# Patient Record
Sex: Female | Born: 1960 | Race: White | Hispanic: No | Marital: Married | Smoking: Former smoker
Health system: Southern US, Community
[De-identification: ages and names within clinical notes are randomized; demographics above are authoritative.]

## PROBLEM LIST (undated history)

## (undated) DIAGNOSIS — Z800 Family history of malignant neoplasm of digestive organs: Secondary | ICD-10-CM

## (undated) DIAGNOSIS — F32A Depression, unspecified: Secondary | ICD-10-CM

## (undated) DIAGNOSIS — F329 Major depressive disorder, single episode, unspecified: Secondary | ICD-10-CM

## (undated) DIAGNOSIS — N870 Mild cervical dysplasia: Secondary | ICD-10-CM

## (undated) DIAGNOSIS — R928 Other abnormal and inconclusive findings on diagnostic imaging of breast: Secondary | ICD-10-CM

## (undated) DIAGNOSIS — G4700 Insomnia, unspecified: Secondary | ICD-10-CM

## (undated) DIAGNOSIS — T7840XA Allergy, unspecified, initial encounter: Secondary | ICD-10-CM

## (undated) DIAGNOSIS — R7989 Other specified abnormal findings of blood chemistry: Secondary | ICD-10-CM

## (undated) HISTORY — DX: Mild cervical dysplasia: N87.0

## (undated) HISTORY — DX: Depression, unspecified: F32.A

## (undated) HISTORY — DX: Major depressive disorder, single episode, unspecified: F32.9

## (undated) HISTORY — DX: Other specified abnormal findings of blood chemistry: R79.89

## (undated) HISTORY — DX: Other abnormal and inconclusive findings on diagnostic imaging of breast: R92.8

## (undated) HISTORY — DX: Allergy, unspecified, initial encounter: T78.40XA

## (undated) HISTORY — DX: Insomnia, unspecified: G47.00

## (undated) HISTORY — DX: Family history of malignant neoplasm of digestive organs: Z80.0

## (undated) HISTORY — PX: TUBAL LIGATION: SHX77

---

## 1987-01-09 HISTORY — PX: CHOLECYSTECTOMY: SHX55

## 1987-01-09 HISTORY — PX: VAGINA SURGERY: SHX829

## 2003-12-16 ENCOUNTER — Ambulatory Visit: Payer: Self-pay | Admitting: Podiatry

## 2004-01-09 HISTORY — PX: FOOT SURGERY: SHX648

## 2007-01-09 DIAGNOSIS — N870 Mild cervical dysplasia: Secondary | ICD-10-CM

## 2007-01-09 HISTORY — DX: Mild cervical dysplasia: N87.0

## 2010-03-29 ENCOUNTER — Ambulatory Visit: Payer: Self-pay | Admitting: Nephrology

## 2011-01-09 HISTORY — PX: COLONOSCOPY: SHX174

## 2011-03-02 ENCOUNTER — Ambulatory Visit: Payer: Self-pay | Admitting: Gastroenterology

## 2014-09-20 ENCOUNTER — Other Ambulatory Visit: Payer: Self-pay | Admitting: Family Medicine

## 2014-09-20 ENCOUNTER — Ambulatory Visit (INDEPENDENT_AMBULATORY_CARE_PROVIDER_SITE_OTHER): Payer: BLUE CROSS/BLUE SHIELD | Admitting: Family Medicine

## 2014-09-20 ENCOUNTER — Encounter: Payer: Self-pay | Admitting: Family Medicine

## 2014-09-20 VITALS — BP 140/92 | HR 95 | Temp 97.8°F | Resp 16 | Wt 163.8 lb

## 2014-09-20 DIAGNOSIS — R1013 Epigastric pain: Secondary | ICD-10-CM | POA: Diagnosis not present

## 2014-09-20 DIAGNOSIS — F32A Depression, unspecified: Secondary | ICD-10-CM | POA: Insufficient documentation

## 2014-09-20 DIAGNOSIS — J302 Other seasonal allergic rhinitis: Secondary | ICD-10-CM | POA: Insufficient documentation

## 2014-09-20 DIAGNOSIS — K635 Polyp of colon: Secondary | ICD-10-CM | POA: Insufficient documentation

## 2014-09-20 DIAGNOSIS — F329 Major depressive disorder, single episode, unspecified: Secondary | ICD-10-CM | POA: Insufficient documentation

## 2014-09-20 DIAGNOSIS — Z8709 Personal history of other diseases of the respiratory system: Secondary | ICD-10-CM | POA: Insufficient documentation

## 2014-09-20 NOTE — Addendum Note (Signed)
Addended by: Jules Schick on: 09/20/2014 03:23 PM   Modules accepted: Orders

## 2014-09-20 NOTE — Progress Notes (Signed)
Patient ID: Rachael Carr, female   DOB: [birth year], 54 y.o.   MRN: [id number]   [name] Female    DOB: Sep 03, 1960   54 y.o.   MRN: [id number] Visit Date: 09/20/2014  Today's Provider: Vernie Murders, PA   Chief Complaint  Patient presents with  . Abdominal Pain    X 2 weeks   Subjective:    Abdominal Pain This is a new problem. The current episode started 1 to 4 weeks ago. The onset quality is sudden. The problem occurs constantly. The problem has been gradually worsening. The pain is located in the generalized abdominal region. The quality of the pain is burning. She has tried antacids for the symptoms. The treatment provided no relief.  Wake up with gnawing in epigastric region, without gas problems and associated with some burning. Helped by use of Alka-Seltzer today. 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 Family history of mother having PUD and father had pancreatic cancer before he died. Denies melena, diarrhea, hematemesis or vomiting. Has used Zantac 75 mg today because discomfort woke her from sleep last night.   Patient Active Problem List   Diagnosis Date Noted  . Depression 09/20/2014  . Colon polyp 09/20/2014  . Allergic rhinitis, seasonal 09/20/2014  . H/O bronchitis 09/20/2014   Past Surgical History  Procedure Laterality Date  . Cholecystectomy  1989  . Vagina surgery  1989  . Foot surgery Left 2006   Family History  Problem Relation Age of Onset  . Cancer Father    Allergies  Allergen Reactions  . Demerol [Meperidine]    Previous Medications   CETIRIZINE (ZYRTEC) 10 MG TABLET    Take 10 mg by mouth daily.   MELATONIN 3 MG CAPS    Take 1 capsule by mouth at bedtime as needed.   MULTIPLE MINERALS-VITAMINS (CITRACAL PLUS PO)    Take 1 tablet by mouth daily.   RANITIDINE HCL (ZANTAC PO)    Take by mouth as needed.   TRIAMCINOLONE (NASACORT ALLERGY 24HR) 55 MCG/ACT AERO NASAL INHALER    Place 2 sprays into the nose  daily.   Review of Systems  Constitutional: Negative.   HENT: Negative.   Eyes: Negative.   Respiratory: Negative.   Cardiovascular: Negative.   Gastrointestinal: Positive for abdominal pain.  Endocrine: Negative.   Genitourinary: Negative.   Musculoskeletal: Negative.   Allergic/Immunologic: Negative.   Neurological: Negative.   Hematological: Negative.   Psychiatric/Behavioral: Negative.    Social History  Substance Use Topics  . Smoking status: Former Smoker -- 8 years    Types: Cigarettes  . Smokeless tobacco: Never Used     Comment: QUIT IN 1980'S  . Alcohol Use: 0.0 oz/week    0 Standard drinks or equivalent per week     Comment: OCCASIONALLY   Objective:   BP 140/92 mmHg  Pulse 95  Temp(Src) 97.8 F (36.6 C) (Oral)  Resp 16  Wt 163 lb 12.8 oz (74.299 kg)  SpO2 98%  Physical Exam  Constitutional: She is oriented to person, place, and time. She appears well-developed and well-nourished. No distress.  HENT:  Head: Normocephalic and atraumatic.  Right Ear: Hearing normal.  Left Ear: Hearing normal.  Nose: Nose normal.  Eyes: Conjunctivae, EOM and lids are normal. Right eye exhibits no discharge. Left eye exhibits no discharge. No scleral icterus.  Neck: Normal range of motion. Neck supple.  Cardiovascular: Normal rate and regular rhythm.   Pulmonary/Chest: Effort normal and  breath sounds normal. No respiratory distress.  Abdominal: Soft. Bowel sounds are normal.  Minimal epigastric discomfort with deep palpation. No organomegaly or masses. BS wnl.  Musculoskeletal: Normal range of motion.  Neurological: She is alert and oriented to person, place, and time.  Skin: Skin is intact. No lesion and no rash noted.  Psychiatric: She has a normal mood and affect. Her speech is normal and behavior is normal. Thought content normal.      Assessment & Plan:     1. Epigastric discomfort Onset over the past couple weeks. Suspect dyspepsia. Will rule out PUD, GERD,  hepatitis, pancreatitis and signs of infection. Need to increase Zantac to 150 mg BID or try OTC Prilosec 20 mg qd. Recommend bland diet and limit acidic, spicy foods or caffeine. Recheck pending reports. - CBC with Differential/Platelet - COMPLETE METABOLIC PANEL WITH GFR - Amylase - H. pylori breath test

## 2014-09-23 LAB — CBC WITH DIFFERENTIAL/PLATELET
BASOS: 1 %
Basophils Absolute: 0 10*3/uL (ref 0.0–0.2)
EOS (ABSOLUTE): 0.1 10*3/uL (ref 0.0–0.4)
EOS: 1 %
HEMATOCRIT: 41.8 % (ref 34.0–46.6)
HEMOGLOBIN: 14.2 g/dL (ref 11.1–15.9)
IMMATURE GRANS (ABS): 0 10*3/uL (ref 0.0–0.1)
IMMATURE GRANULOCYTES: 0 %
LYMPHS: 30 %
Lymphocytes Absolute: 2.3 10*3/uL (ref 0.7–3.1)
MCH: 29.2 pg (ref 26.6–33.0)
MCHC: 34 g/dL (ref 31.5–35.7)
MCV: 86 fL (ref 79–97)
MONOCYTES: 6 %
MONOS ABS: 0.5 10*3/uL (ref 0.1–0.9)
NEUTROS PCT: 62 %
Neutrophils Absolute: 4.9 10*3/uL (ref 1.4–7.0)
Platelets: 351 10*3/uL (ref 150–379)
RBC: 4.86 x10E6/uL (ref 3.77–5.28)
RDW: 13.4 % (ref 12.3–15.4)
WBC: 7.8 10*3/uL (ref 3.4–10.8)

## 2014-09-23 LAB — COMPREHENSIVE METABOLIC PANEL
A/G RATIO: 1.9 (ref 1.1–2.5)
ALBUMIN: 4.9 g/dL (ref 3.5–5.5)
ALT: 42 IU/L — ABNORMAL HIGH (ref 0–32)
AST: 27 IU/L (ref 0–40)
Alkaline Phosphatase: 88 IU/L (ref 39–117)
BUN/Creatinine Ratio: 14 (ref 9–23)
BUN: 9 mg/dL (ref 6–24)
Bilirubin Total: 0.5 mg/dL (ref 0.0–1.2)
CALCIUM: 10.2 mg/dL (ref 8.7–10.2)
CO2: 25 mmol/L (ref 18–29)
CREATININE: 0.66 mg/dL (ref 0.57–1.00)
Chloride: 100 mmol/L (ref 97–108)
GFR, EST AFRICAN AMERICAN: 114 mL/min/{1.73_m2} (ref >59)
GFR, EST NON AFRICAN AMERICAN: 99 mL/min/{1.73_m2} (ref >59)
GLOBULIN, TOTAL: 2.6 g/dL (ref 1.5–4.5)
Glucose: 91 mg/dL (ref 65–99)
POTASSIUM: 5.5 mmol/L — AB (ref 3.5–5.2)
SODIUM: 142 mmol/L (ref 134–144)
TOTAL PROTEIN: 7.5 g/dL (ref 6.0–8.5)

## 2014-09-23 LAB — AMYLASE: Amylase: 56 U/L (ref 31–124)

## 2014-09-23 LAB — IFOBT (OCCULT BLOOD): IFOBT: NEGATIVE

## 2014-09-23 LAB — H. PYLORI BREATH TEST: H. PYLORI UBIT: NEGATIVE

## 2014-09-27 ENCOUNTER — Telehealth: Payer: Self-pay

## 2014-09-27 NOTE — Telephone Encounter (Signed)
Patient advised as directed below. Patient verbalized understanding and agrees with treatment plan. Lab results printed at front desk for pick up per patient's request.

## 2014-09-27 NOTE — Telephone Encounter (Signed)
-----   Message from Margo Common, [place] sent at 09/24/2014  6:30 PM EDT ----- No sign of infection or blood loss/anemia on blood cell counts. Pancreas test normal and negative H.pylori test (the bacteria that can cause ulcers). Potassium slightly elevated. Should not use "No-Salt" (a salt substitute that has a lot of potassium in it) and drink extra water. Proceed with use of full dose Zantac (150 mg BID) or Prilosec 20 mg qd. If no improvement in epigastric discomfort, will need to schedule referral to the gastroenterologist.

## 2014-11-26 ENCOUNTER — Encounter: Payer: Self-pay | Admitting: Family Medicine

## 2014-12-13 ENCOUNTER — Ambulatory Visit (INDEPENDENT_AMBULATORY_CARE_PROVIDER_SITE_OTHER): Payer: BLUE CROSS/BLUE SHIELD | Admitting: Family Medicine

## 2014-12-13 ENCOUNTER — Encounter: Payer: Self-pay | Admitting: Family Medicine

## 2014-12-13 ENCOUNTER — Other Ambulatory Visit: Payer: Self-pay

## 2014-12-13 VITALS — BP 104/82 | HR 68 | Temp 98.1°F | Resp 16 | Wt 151.8 lb

## 2014-12-13 DIAGNOSIS — Z7189 Other specified counseling: Secondary | ICD-10-CM | POA: Diagnosis not present

## 2014-12-13 DIAGNOSIS — Z789 Other specified health status: Secondary | ICD-10-CM | POA: Diagnosis not present

## 2014-12-13 DIAGNOSIS — Z9189 Other specified personal risk factors, not elsewhere classified: Secondary | ICD-10-CM

## 2014-12-13 DIAGNOSIS — Z23 Encounter for immunization: Secondary | ICD-10-CM

## 2014-12-13 NOTE — Progress Notes (Signed)
Patient ID: Rachael Carr, female   DOB: [birth year], 54 y.o.   MRN: [id number] Name: Rachael Carr   MRN: [id number]    DOB: 01/24/1960   Date:12/13/2014       Progress Note  Subjective  Chief Complaint  Chief Complaint  Patient presents with  . Labs Only  . Immunizations    HPI This 54 year old female requests pneumonia vaccination because of her history of allergies and past bronchopneumonia. No recent fever or congestion symptoms. Also, questions need for hepatitis C blood test. Asymptomatic.   Past Medical History  Diagnosis Date  . Depression   . Allergy     Social History  Substance Use Topics  . Smoking status: Former Smoker -- 8 years    Types: Cigarettes  . Smokeless tobacco: Never Used     Comment: QUIT IN 1980'S  . Alcohol Use: 0.0 oz/week    0 Standard drinks or equivalent per week     Comment: OCCASIONALLY     Current outpatient prescriptions:  .  cetirizine (ZYRTEC) 10 MG tablet, Take 10 mg by mouth daily., Disp: , Rfl:  .  fluticasone (FLONASE) 50 MCG/ACT nasal spray, Place into both nostrils daily., Disp: , Rfl:  .  montelukast (SINGULAIR) 10 MG tablet, , Disp: , Rfl: 10 .  Multiple Minerals-Vitamins (CITRACAL PLUS PO), Take 1 tablet by mouth daily., Disp: , Rfl:  .  RaNITidine HCl (ZANTAC PO), Take by mouth as needed., Disp: , Rfl:  .  triamcinolone (NASACORT ALLERGY 24HR) 55 MCG/ACT AERO nasal inhaler, Place 2 sprays into the nose daily., Disp: , Rfl:  .  zolpidem (AMBIEN) 5 MG tablet, TAKE 1/2 - 1 TABLET BY MOUTH AT BEDTIME, Disp: , Rfl: 2  Allergies  Allergen Reactions  . Demerol [Meperidine]     Review of Systems  Constitutional: Negative.   Respiratory: Negative.   Cardiovascular: Negative.   Gastrointestinal: Positive for diarrhea.       Occurs with greasy foods since having GB surgery 29 years ago.  Musculoskeletal: Negative.   Neurological: Negative.   Psychiatric/Behavioral: Negative.    Objective  Filed Vitals:   12/13/14 0813   BP: 104/82  Pulse: 68  Temp: 98.1 F (36.7 C)  TempSrc: Oral  Resp: 16  Weight: 151 lb 12.8 oz (68.856 kg)    Physical Exam  Constitutional: She is oriented to person, place, and time and well-developed, well-nourished, and in no distress.  HENT:  Head: Normocephalic.  Eyes: Conjunctivae and EOM are normal.  Neck: Normal range of motion. Neck supple.  Cardiovascular: Normal rate, regular rhythm and normal heart sounds.   Pulmonary/Chest: Effort normal and breath sounds normal.  Abdominal: Soft. Bowel sounds are normal. She exhibits mass.  Musculoskeletal: Normal range of motion.  Neurological: She is alert and oriented to person, place, and time.  Skin: No rash noted.  Psychiatric: Affect and judgment normal.    Recent Results (from the past 2160 hour(s))  CBC with Differential/Platelet     Status: None   Collection Time: 09/20/14  3:34 PM  Result Value Ref Range   WBC 7.8 3.4 - 10.8 x10E3/uL   RBC 4.86 3.77 - 5.28 x10E6/uL   Hemoglobin 14.2 11.1 - 15.9 g/dL   Hematocrit 41.8 34.0 - 46.6 %   MCV 86 79 - 97 fL   MCH 29.2 26.6 - 33.0 pg   MCHC 34.0 31.5 - 35.7 g/dL   RDW 13.4 12.3 - 15.4 %   Platelets 351 150 -  379 x10E3/uL   Neutrophils 62 %   Lymphs 30 %   Monocytes 6 %   Eos 1 %   Basos 1 %   Neutrophils Absolute 4.9 1.4 - 7.0 x10E3/uL   Lymphocytes Absolute 2.3 0.7 - 3.1 x10E3/uL   Monocytes Absolute 0.5 0.1 - 0.9 x10E3/uL   EOS (ABSOLUTE) 0.1 0.0 - 0.4 x10E3/uL   Basophils Absolute 0.0 0.0 - 0.2 x10E3/uL   Immature Granulocytes 0 %   Immature Grans (Abs) 0.0 0.0 - 0.1 x10E3/uL  Comprehensive metabolic panel     Status: Abnormal   Collection Time: 09/20/14  3:34 PM  Result Value Ref Range   Glucose 91 65 - 99 mg/dL   BUN 9 6 - 24 mg/dL   Creatinine, Ser 0.66 0.57 - 1.00 mg/dL   GFR calc non Af Amer 99 >59 mL/min/1.73   GFR calc Af Amer 114 >59 mL/min/1.73   BUN/Creatinine Ratio 14 9 - 23   Sodium 142 134 - 144 mmol/L   Potassium 5.5 (H) 3.5 - 5.2 mmol/L    Chloride 100 97 - 108 mmol/L   CO2 25 18 - 29 mmol/L   Calcium 10.2 8.7 - 10.2 mg/dL   Total Protein 7.5 6.0 - 8.5 g/dL   Albumin 4.9 3.5 - 5.5 g/dL   Globulin, Total 2.6 1.5 - 4.5 g/dL   Albumin/Globulin Ratio 1.9 1.1 - 2.5   Bilirubin Total 0.5 0.0 - 1.2 mg/dL   Alkaline Phosphatase 88 39 - 117 IU/L   AST 27 0 - 40 IU/L   ALT 42 (H) 0 - 32 IU/L  Amylase     Status: None   Collection Time: 09/20/14  3:34 PM  Result Value Ref Range   Amylase 56 31 - 124 U/L  H. pylori breath test     Status: None   Collection Time: 09/20/14  3:34 PM  Result Value Ref Range   H. pylori UBiT Negative Negative  IFOBT POC (occult bld, rslt in office)     Status: Normal   Collection Time: 09/23/14  8:40 AM  Result Value Ref Range   IFOBT Negative     Assessment & Plan  1. Counseling on health promotion and disease prevention Feeling well and had flu shot in October 2016 at [facility]. Saw the commercial regarding Hepatitis C blood test for "baby boomers". Husband had his physical and got the blood test. Asymptomatic without IV drug use history. Will wait until her physical next year to get the blood test done after counseling.  2. Streptococcus pneumoniae vaccination indicated Requests pneumonia vaccination since she her GYN advised getting with her history of past bronchopneumonia and allergies. Will give Pnemovax today and plan Prevnar-13 at age 58. No sign of infections today.

## 2015-08-02 DIAGNOSIS — N939 Abnormal uterine and vaginal bleeding, unspecified: Secondary | ICD-10-CM | POA: Diagnosis not present

## 2015-08-25 DIAGNOSIS — N840 Polyp of corpus uteri: Secondary | ICD-10-CM | POA: Diagnosis not present

## 2015-08-25 DIAGNOSIS — N950 Postmenopausal bleeding: Secondary | ICD-10-CM | POA: Diagnosis not present

## 2015-09-20 ENCOUNTER — Encounter
Admission: RE | Admit: 2015-09-20 | Discharge: 2015-09-20 | Disposition: A | Discharge status: Home / Self Care | Payer: BLUE CROSS/BLUE SHIELD | Source: Ambulatory Visit | Attending: Obstetrics and Gynecology | Admitting: Obstetrics and Gynecology

## 2015-09-20 DIAGNOSIS — Z01812 Encounter for preprocedural laboratory examination: Secondary | ICD-10-CM | POA: Insufficient documentation

## 2015-09-20 DIAGNOSIS — N950 Postmenopausal bleeding: Secondary | ICD-10-CM | POA: Diagnosis not present

## 2015-09-20 DIAGNOSIS — Z01818 Encounter for other preprocedural examination: Secondary | ICD-10-CM | POA: Insufficient documentation

## 2015-09-20 DIAGNOSIS — N840 Polyp of corpus uteri: Secondary | ICD-10-CM | POA: Diagnosis not present

## 2015-09-20 LAB — HEMOGLOBIN: HEMOGLOBIN: 14.4 g/dL (ref 12.0–16.0)

## 2015-09-20 NOTE — Patient Instructions (Signed)
Your procedure is scheduled on: Tuesday 09/27/15 Report to Day Surgery. 2ND FLOOR MEDICAL MALL ENTRANCE To find out your arrival time please call [phone] between 1PM - 3PM on Monday 09/26/15.  Remember: Instructions that are not followed completely may result in serious medical risk, up to and including death, or upon the discretion of your surgeon and anesthesiologist your surgery may need to be rescheduled.    __X__ 1. Do not eat food or drink liquids after midnight. No gum chewing or hard candies.     __X__ 2. No Alcohol/SMOKING for 24 hours before or after surgery.   ____ 3. Bring all medications with you on the day of surgery if instructed.    __X__ 4. Notify your doctor if there is any change in your medical condition     (cold, fever, infections).     Do not wear jewelry, make-up, hairpins, clips or nail polish.  Do not wear lotions, powders, or perfumes.   Do not shave 48 hours prior to surgery. Men may shave face and neck.  Do not bring valuables to the hospital.    [facility] is not responsible for any belongings or valuables.               Contacts, dentures or bridgework may not be worn into surgery.  Leave your suitcase in the car. After surgery it may be brought to your room.  For patients admitted to the hospital, discharge time is determined by your                treatment team.   Patients discharged the day of surgery will not be allowed to drive home.   Please read over the following fact sheets that you were given:   Surgical Site Infection Prevention   __X__ Take these medicines the morning of surgery with A SIP OF WATER:    1. ZYRTEC  2.   3.   4.  5.  6.  ____ Fleet Enema (as directed)   ____ Use CHG Soap as directed  ____ Use inhalers on the day of surgery  ____ Stop metformin 2 days prior to surgery    ____ Take 1/2 of usual insulin dose the night before surgery and none on the morning of surgery.   ____ Stop Coumadin/Plavix/aspirin on    ____ Stop Anti-inflammatories on    __X__ Stop supplements until after surgery.    ____ Bring C-Pap to the hospital.

## 2015-09-27 ENCOUNTER — Ambulatory Visit: Payer: BLUE CROSS/BLUE SHIELD | Admitting: Anesthesiology

## 2015-09-27 ENCOUNTER — Encounter
Admission: RE | Disposition: A | Discharge status: Home / Self Care | Payer: Self-pay | Source: Ambulatory Visit | Attending: Obstetrics and Gynecology

## 2015-09-27 ENCOUNTER — Encounter: Payer: Self-pay | Admitting: *Deleted

## 2015-09-27 ENCOUNTER — Ambulatory Visit
Admission: RE | Admit: 2015-09-27 | Discharge: 2015-09-27 | Disposition: A | Discharge status: Home / Self Care | Payer: BLUE CROSS/BLUE SHIELD | Source: Ambulatory Visit | Attending: Obstetrics and Gynecology | Admitting: Obstetrics and Gynecology

## 2015-09-27 DIAGNOSIS — N950 Postmenopausal bleeding: Secondary | ICD-10-CM | POA: Diagnosis not present

## 2015-09-27 DIAGNOSIS — J309 Allergic rhinitis, unspecified: Secondary | ICD-10-CM | POA: Diagnosis not present

## 2015-09-27 DIAGNOSIS — Z87891 Personal history of nicotine dependence: Secondary | ICD-10-CM | POA: Insufficient documentation

## 2015-09-27 DIAGNOSIS — Z885 Allergy status to narcotic agent status: Secondary | ICD-10-CM | POA: Insufficient documentation

## 2015-09-27 DIAGNOSIS — Z888 Allergy status to other drugs, medicaments and biological substances status: Secondary | ICD-10-CM | POA: Insufficient documentation

## 2015-09-27 DIAGNOSIS — Z79899 Other long term (current) drug therapy: Secondary | ICD-10-CM | POA: Diagnosis not present

## 2015-09-27 DIAGNOSIS — N840 Polyp of corpus uteri: Secondary | ICD-10-CM | POA: Insufficient documentation

## 2015-09-27 DIAGNOSIS — Z833 Family history of diabetes mellitus: Secondary | ICD-10-CM | POA: Insufficient documentation

## 2015-09-27 DIAGNOSIS — Z800 Family history of malignant neoplasm of digestive organs: Secondary | ICD-10-CM | POA: Diagnosis not present

## 2015-09-27 DIAGNOSIS — K219 Gastro-esophageal reflux disease without esophagitis: Secondary | ICD-10-CM | POA: Insufficient documentation

## 2015-09-27 DIAGNOSIS — Z9049 Acquired absence of other specified parts of digestive tract: Secondary | ICD-10-CM | POA: Diagnosis not present

## 2015-09-27 DIAGNOSIS — Z8249 Family history of ischemic heart disease and other diseases of the circulatory system: Secondary | ICD-10-CM | POA: Diagnosis not present

## 2015-09-27 HISTORY — PX: DILATATION & CURETTAGE/HYSTEROSCOPY WITH MYOSURE: SHX6511

## 2015-09-27 SURGERY — DILATATION & CURETTAGE/HYSTEROSCOPY WITH MYOSURE
Anesthesia: General

## 2015-09-27 MED ORDER — LIDOCAINE HCL (CARDIAC) 20 MG/ML IV SOLN
INTRAVENOUS | Status: DC | PRN
  Administered 2015-09-27: 50 mg via INTRAVENOUS

## 2015-09-27 MED ORDER — FAMOTIDINE 20 MG PO TABS
ORAL_TABLET | ORAL | Status: AC
  Filled 2015-09-27: qty 1

## 2015-09-27 MED ORDER — FENTANYL CITRATE (PF) 100 MCG/2ML IJ SOLN
INTRAMUSCULAR | Status: DC | PRN
  Administered 2015-09-27: 100 ug via INTRAVENOUS

## 2015-09-27 MED ORDER — FENTANYL CITRATE (PF) 100 MCG/2ML IJ SOLN
INTRAMUSCULAR | Status: AC
  Administered 2015-09-27: 25 ug via INTRAVENOUS
  Filled 2015-09-27: qty 2

## 2015-09-27 MED ORDER — KETOROLAC TROMETHAMINE 30 MG/ML IJ SOLN
INTRAMUSCULAR | Status: DC | PRN
Start: 2015-09-27 — End: 2015-09-27
  Administered 2015-09-27: 30 mg via INTRAVENOUS

## 2015-09-27 MED ORDER — IBUPROFEN 600 MG PO TABS: 600.0000 mg | ORAL_TABLET | Freq: Four times a day (QID) | ORAL | 0 refills | Status: DC | PRN

## 2015-09-27 MED ORDER — ONDANSETRON HCL 4 MG/2ML IJ SOLN: 4.0000 mg | Freq: Once | INTRAMUSCULAR | Status: DC | PRN

## 2015-09-27 MED ORDER — PROPOFOL 10 MG/ML IV BOLUS
INTRAVENOUS | Status: DC | PRN
  Administered 2015-09-27: 200 mg via INTRAVENOUS

## 2015-09-27 MED ORDER — LACTATED RINGERS IV SOLN
INTRAVENOUS | Status: DC
  Administered 2015-09-27: 09:00:00 via INTRAVENOUS

## 2015-09-27 MED ORDER — TRAMADOL HCL 50 MG PO TABS: 50.0000 mg | ORAL_TABLET | Freq: Four times a day (QID) | ORAL | 0 refills | Status: DC | PRN

## 2015-09-27 MED ORDER — FENTANYL CITRATE (PF) 100 MCG/2ML IJ SOLN
25.0000 ug | INTRAMUSCULAR | Status: AC | PRN
  Administered 2015-09-27 (×6): 25 ug via INTRAVENOUS

## 2015-09-27 MED ORDER — ONDANSETRON HCL 4 MG/2ML IJ SOLN
INTRAMUSCULAR | Status: DC | PRN
  Administered 2015-09-27: 4 mg via INTRAVENOUS

## 2015-09-27 MED ORDER — DEXAMETHASONE SODIUM PHOSPHATE 10 MG/ML IJ SOLN
INTRAMUSCULAR | Status: DC | PRN
  Administered 2015-09-27: 8 mg via INTRAVENOUS

## 2015-09-27 MED ORDER — MIDAZOLAM HCL 2 MG/2ML IJ SOLN
INTRAMUSCULAR | Status: DC | PRN
  Administered 2015-09-27: 2 mg via INTRAVENOUS

## 2015-09-27 MED ORDER — FAMOTIDINE 20 MG PO TABS
20.0000 mg | ORAL_TABLET | Freq: Once | ORAL | Status: AC
  Administered 2015-09-27: 20 mg via ORAL

## 2015-09-27 SURGICAL SUPPLY — 19 items
ABLATOR ENDOMETRIAL MYOSURE (ABLATOR) IMPLANT
CANISTER SUC SOCK COL 7IN (MISCELLANEOUS) ×2 IMPLANT
CATH ROBINSON RED A/P 16FR (CATHETERS) ×2 IMPLANT
DEVICE MYOSURE LITE (MISCELLANEOUS) ×2 IMPLANT
ELECT REM PT RETURN 9FT ADLT (ELECTROSURGICAL) ×2
ELECTRODE REM PT RTRN 9FT ADLT (ELECTROSURGICAL) ×1 IMPLANT
GLOVE BIO SURGEON STRL SZ7 (GLOVE) ×6 IMPLANT
GLOVE BIOGEL PI IND STRL 7.5 (GLOVE) ×3 IMPLANT
GLOVE BIOGEL PI INDICATOR 7.5 (GLOVE) ×3
GOWN STRL REUS W/ TWL LRG LVL3 (GOWN DISPOSABLE) ×1 IMPLANT
GOWN STRL REUS W/TWL LRG LVL3 (GOWN DISPOSABLE) ×1
IV LACTATED RINGER IRRG 3000ML (IV SOLUTION) ×1
IV LR IRRIG 3000ML ARTHROMATIC (IV SOLUTION) ×1 IMPLANT
KIT RM TURNOVER CYSTO AR (KITS) ×2 IMPLANT
PACK DNC HYST (MISCELLANEOUS) ×2 IMPLANT
PAD OB MATERNITY 4.3X12.25 (PERSONAL CARE ITEMS) ×2 IMPLANT
PAD PREP 24X41 OB/GYN DISP (PERSONAL CARE ITEMS) ×2 IMPLANT
TUBING CONNECTING 10 (TUBING) ×2 IMPLANT
TUBING HYSTEROSCOPY DOLPHIN (MISCELLANEOUS) ×2 IMPLANT

## 2015-09-27 NOTE — Anesthesia Procedure Notes (Signed)
Procedure Name: LMA Insertion Date/Time: 09/27/2015 9:46 AM Performed by: Darlyne Russian Pre-anesthesia Checklist: Patient identified, Emergency Drugs available, Suction available and Patient being monitored Patient Re-evaluated:Patient Re-evaluated prior to inductionOxygen Delivery Method: Circle system utilized Preoxygenation: Pre-oxygenation with 100% oxygen Intubation Type: IV induction Ventilation: Mask ventilation without difficulty LMA: LMA inserted LMA Size: 3.0 Number of attempts: 1 Dental Injury: Teeth and Oropharynx as per pre-operative assessment

## 2015-09-27 NOTE — Transfer of Care (Signed)
Immediate Anesthesia Transfer of Care Note  [name]  Procedure(s) Performed: Procedure(s): DILATATION & CURETTAGE/HYSTEROSCOPY WITH MYOSURE (N/A)  Patient Location: PACU  Anesthesia Type:General  Level of Consciousness: responds to stimulation  Airway & Oxygen Therapy: Patient Spontanous Breathing and Patient connected to nasal cannula oxygen  Post-op Assessment: Report given to RN and Post -op Vital signs reviewed and stable  Post vital signs: Reviewed  Last Vitals:  Vitals:   09/27/15 0808 09/27/15 1038  BP: (!) 147/90 126/82  Pulse: 87 77  Resp: 16 10  Temp: 36.4 C 36.1 C    Last Pain:  Vitals:   09/27/15 0808  TempSrc: Tympanic  PainSc: 3          Complications: No apparent anesthesia complications

## 2015-09-27 NOTE — Anesthesia Postprocedure Evaluation (Signed)
Anesthesia Post Note  [name]  Procedure(s) Performed: Procedure(s) (LRB): DILATATION & CURETTAGE/HYSTEROSCOPY WITH MYOSURE (N/A)  Patient location during evaluation: PACU Anesthesia Type: General Level of consciousness: awake and alert Pain management: pain level controlled Vital Signs Assessment: post-procedure vital signs reviewed and stable Respiratory status: spontaneous breathing and respiratory function stable Cardiovascular status: stable Anesthetic complications: no    Last Vitals:  Vitals:   09/27/15 1123 09/27/15 1133  BP: 116/76   Pulse: 68 69  Resp: 10 17  Temp:  36.3 C    Last Pain:  Vitals:   09/27/15 1133  TempSrc:   PainSc: 2                  KEPHART,WILLIAM K

## 2015-09-27 NOTE — Anesthesia Preprocedure Evaluation (Signed)
Anesthesia Evaluation  Patient identified by MRN, date of birth, ID band Patient awake    Reviewed: Allergy & Precautions, NPO status , Patient's Chart, lab work & pertinent test results  History of Anesthesia Complications Negative for: history of anesthetic complications  Airway Mallampati: II       Dental   Pulmonary neg pulmonary ROS, former smoker,  Seasonal allergies          Cardiovascular negative cardio ROS       Neuro/Psych Depression negative neurological ROS     GI/Hepatic Neg liver ROS, GERD  Controlled,  Endo/Other  negative endocrine ROS  Renal/GU negative Renal ROS     Musculoskeletal   Abdominal   Peds  Hematology negative hematology ROS (+)   Anesthesia Other Findings   Reproductive/Obstetrics                             Anesthesia Physical Anesthesia Plan  ASA: II  Anesthesia Plan: General   Post-op Pain Management:    Induction: Intravenous  Airway Management Planned: LMA  Additional Equipment:   Intra-op Plan:   Post-operative Plan:   Informed Consent: I have reviewed the patients History and Physical, chart, labs and discussed the procedure including the risks, benefits and alternatives for the proposed anesthesia with the patient or authorized representative who has indicated his/her understanding and acceptance.     Plan Discussed with:   Anesthesia Plan Comments:         Anesthesia Quick Evaluation

## 2015-09-27 NOTE — H&P (Signed)
History and Physical Interval Note:  Rachael Carr  has presented today for surgery, with the diagnosis of ENDOMETRIAL POLYP  The various methods of treatment have been discussed with the patient and family. After consideration of risks, benefits and other options for treatment, the patient has consented to  Procedure(s): [place] (N/A) as a surgical intervention .  The patient's history has been reviewed, patient examined, no change in status, stable for surgery.  I have reviewed the patient's chart and labs.  Questions were answered to the patient's satisfaction.    Will Bonnet, MD 09/27/2015 9:30 AM

## 2015-09-27 NOTE — Op Note (Signed)
  Operative Note    Pre-Op Diagnosis:  1) postmenopausal bleeding 2) endometrial polyp  Post-Op Diagnosis:  1) postmenopausal bleeding 2) endometrial polyp  Procedures:  1. Dilation and Curettage 2. Hysteroscopy 3. Endometrial polypectomy  Primary Surgeon: Prentice Docker, MD   EBL: 10 mL   IVF: 600 mL   Urine output: 200 mL at beginning of procedure  Specimens:  1) Endometrial polyp 2) endometrial curettings  Drains: non  Complications: None   Disposition: PACU   Condition: Stable   Findings:  1) posterior endometrial polyp, approximately 3cm x 2cm 2) otherwise normal appearing endometrial cavity  Procedure Summary:  After informed consent was obtained, the patient was taken to the operating room where anesthesia was obtained without difficulty. The patient was positioned in the dorsal supine lithotomy position in candy cane stirrups and she was prepped and draped in the usual sterile manner.  After a time-out was called, the patient's bladder was catheterized with an in-and-out foley catheter.  The patient was examined under anesthesia, with the above noted findings.  The bi-valved speculum was placed in the vagina, and the the anterior lip of the cervix was grasped with the single-tooth tenaculum.  The cervix was progressively dilated to a 6 mm Hegar dilator.  The MyoSure hysteroscope was introduced, with the above noted findings. The small MyoSure device was attached to the hysteroscope.  The polyp was removed in its entirety using the MyoSure device.  The hystersocope was removed and the uterine cavity was curetted gently to take a general endometrial sample.  The hysteroscope was re-inserted and excellent hemostasis was noted.  The hysteroscope and all instruments were removed with verification that no sponges or instruments were left in the vagina.  She was then taken out of dorsal lithotomy position.  The patient tolerated the procedure well.  Sponge, lap and  needle counts were correct x2.  For VTE prophylaxis she was wearing SCDs throughout the case.  No antibiotics were indicated nor given. The patient was taken to recovery room in excellent condition.  Will Bonnet, MD 09/27/2015 10:31 AM

## 2015-09-27 NOTE — Discharge Instructions (Signed)

## 2015-09-27 NOTE — Anesthesia Procedure Notes (Signed)
Performed by: Ory Elting       

## 2015-09-28 LAB — SURGICAL PATHOLOGY

## 2015-10-10 ENCOUNTER — Other Ambulatory Visit: Payer: Self-pay | Admitting: Obstetrics and Gynecology

## 2015-10-10 DIAGNOSIS — Z1231 Encounter for screening mammogram for malignant neoplasm of breast: Secondary | ICD-10-CM

## 2015-11-02 DIAGNOSIS — D225 Melanocytic nevi of trunk: Secondary | ICD-10-CM | POA: Diagnosis not present

## 2015-11-14 ENCOUNTER — Ambulatory Visit
Admission: RE | Admit: 2015-11-14 | Discharge: 2015-11-14 | Disposition: A | Discharge status: Home / Self Care | Payer: BLUE CROSS/BLUE SHIELD | Source: Ambulatory Visit | Attending: Obstetrics and Gynecology | Admitting: Obstetrics and Gynecology

## 2015-11-14 DIAGNOSIS — Z23 Encounter for immunization: Secondary | ICD-10-CM | POA: Diagnosis not present

## 2015-11-14 DIAGNOSIS — H5203 Hypermetropia, bilateral: Secondary | ICD-10-CM | POA: Diagnosis not present

## 2015-11-14 DIAGNOSIS — Z1239 Encounter for other screening for malignant neoplasm of breast: Secondary | ICD-10-CM | POA: Diagnosis not present

## 2015-11-14 DIAGNOSIS — Z1231 Encounter for screening mammogram for malignant neoplasm of breast: Secondary | ICD-10-CM | POA: Diagnosis not present

## 2015-11-14 DIAGNOSIS — Z01419 Encounter for gynecological examination (general) (routine) without abnormal findings: Secondary | ICD-10-CM | POA: Diagnosis not present

## 2015-11-18 ENCOUNTER — Inpatient Hospital Stay
Admission: RE | Admit: 2015-11-18 | Discharge: 2015-11-18 | Disposition: A | Discharge status: Home / Self Care | Payer: Self-pay | Source: Ambulatory Visit | Attending: *Deleted | Admitting: *Deleted

## 2015-11-18 ENCOUNTER — Other Ambulatory Visit: Payer: Self-pay | Admitting: *Deleted

## 2015-11-18 DIAGNOSIS — Z9289 Personal history of other medical treatment: Secondary | ICD-10-CM

## 2015-12-08 DIAGNOSIS — Z7689 Persons encountering health services in other specified circumstances: Secondary | ICD-10-CM | POA: Diagnosis not present

## 2015-12-08 DIAGNOSIS — H6123 Impacted cerumen, bilateral: Secondary | ICD-10-CM | POA: Diagnosis not present

## 2015-12-08 DIAGNOSIS — J019 Acute sinusitis, unspecified: Secondary | ICD-10-CM | POA: Diagnosis not present

## 2015-12-08 DIAGNOSIS — F5102 Adjustment insomnia: Secondary | ICD-10-CM | POA: Diagnosis not present

## 2015-12-08 DIAGNOSIS — Z0000 Encounter for general adult medical examination without abnormal findings: Secondary | ICD-10-CM | POA: Diagnosis not present

## 2016-10-02 ENCOUNTER — Other Ambulatory Visit: Payer: Self-pay | Admitting: Obstetrics and Gynecology

## 2016-10-02 DIAGNOSIS — Z1231 Encounter for screening mammogram for malignant neoplasm of breast: Secondary | ICD-10-CM

## 2016-10-26 DIAGNOSIS — Z23 Encounter for immunization: Secondary | ICD-10-CM | POA: Diagnosis not present

## 2016-11-08 DIAGNOSIS — R928 Other abnormal and inconclusive findings on diagnostic imaging of breast: Secondary | ICD-10-CM

## 2016-11-08 HISTORY — DX: Other abnormal and inconclusive findings on diagnostic imaging of breast: R92.8

## 2016-11-15 ENCOUNTER — Ambulatory Visit (INDEPENDENT_AMBULATORY_CARE_PROVIDER_SITE_OTHER): Payer: BLUE CROSS/BLUE SHIELD | Admitting: Obstetrics and Gynecology

## 2016-11-15 ENCOUNTER — Encounter: Payer: Self-pay | Admitting: Obstetrics and Gynecology

## 2016-11-15 ENCOUNTER — Ambulatory Visit
Admission: RE | Admit: 2016-11-15 | Discharge: 2016-11-15 | Disposition: A | Discharge status: Home / Self Care | Payer: BLUE CROSS/BLUE SHIELD | Source: Ambulatory Visit | Attending: Obstetrics and Gynecology | Admitting: Obstetrics and Gynecology

## 2016-11-15 VITALS — BP 122/80 | HR 88 | Ht 66.0 in | Wt 160.0 lb

## 2016-11-15 DIAGNOSIS — Z1239 Encounter for other screening for malignant neoplasm of breast: Secondary | ICD-10-CM

## 2016-11-15 DIAGNOSIS — N6320 Unspecified lump in the left breast, unspecified quadrant: Secondary | ICD-10-CM | POA: Insufficient documentation

## 2016-11-15 DIAGNOSIS — J302 Other seasonal allergic rhinitis: Secondary | ICD-10-CM

## 2016-11-15 DIAGNOSIS — Z01419 Encounter for gynecological examination (general) (routine) without abnormal findings: Secondary | ICD-10-CM

## 2016-11-15 DIAGNOSIS — N952 Postmenopausal atrophic vaginitis: Secondary | ICD-10-CM

## 2016-11-15 DIAGNOSIS — H524 Presbyopia: Secondary | ICD-10-CM | POA: Diagnosis not present

## 2016-11-15 DIAGNOSIS — R928 Other abnormal and inconclusive findings on diagnostic imaging of breast: Secondary | ICD-10-CM | POA: Insufficient documentation

## 2016-11-15 DIAGNOSIS — Z1211 Encounter for screening for malignant neoplasm of colon: Secondary | ICD-10-CM | POA: Diagnosis not present

## 2016-11-15 DIAGNOSIS — Z1231 Encounter for screening mammogram for malignant neoplasm of breast: Secondary | ICD-10-CM | POA: Diagnosis not present

## 2016-11-15 DIAGNOSIS — G4700 Insomnia, unspecified: Secondary | ICD-10-CM

## 2016-11-15 DIAGNOSIS — Z124 Encounter for screening for malignant neoplasm of cervix: Secondary | ICD-10-CM

## 2016-11-15 DIAGNOSIS — Z1151 Encounter for screening for human papillomavirus (HPV): Secondary | ICD-10-CM | POA: Diagnosis not present

## 2016-11-15 MED ORDER — ZOLPIDEM TARTRATE 5 MG PO TABS: 5.0000 mg | ORAL_TABLET | Freq: Every evening | ORAL | 0 refills | Status: DC | PRN

## 2016-11-15 MED ORDER — MONTELUKAST SODIUM 10 MG PO TABS: 10.0000 mg | ORAL_TABLET | ORAL | 3 refills | Status: DC | PRN

## 2016-11-15 MED ORDER — ESTROGENS, CONJUGATED 0.625 MG/GM VA CREA: TOPICAL_CREAM | VAGINAL | 2 refills | Status: DC

## 2016-11-15 NOTE — Patient Instructions (Signed)
I value your feedback and appreciate you entrusting us with your care. If you get a [facility] patient survey, I would appreciate you taking the time to let us know what your experience was like. Thank you! 

## 2016-11-15 NOTE — Progress Notes (Signed)
PCP: Maryland Pink, MD   Chief Complaint  Patient presents with  . Gynecologic Exam    HPI:      [name] is a 56 y.o. [phone] who LMP was No LMP recorded. Patient is postmenopausal., presents today for her annual examination.  Her menses are absent due to menopause. She does not have intermenstrual bleeding. She is s/p D&C/polypectomy/hysteroscopy 9/17 for PMB. PMB resolved after procedure.  She does have vasomotor sx that are tolerable. She also has occas insomnia and takes zolpidem sparingly, when absolutely needed. She needs Rx RF.   Sex activity: single partner, contraception - post menopausal status. She does have vaginal dryness and scant amount of bleeding and pain with sex. She has tried lubricants without relief. She used vag ERT in the past with sx relief. She is interested in retrying it.   Last Pap: November 09, 2013  Results were: no abnormalities /neg HPV DNA.  Hx of STDs: none  Last mammogram: November 14, 2015  Results were: normal--routine follow-up in 12 months. Mammo appt today. There is no FH of breast cancer. There is no FH of ovarian cancer. The patient does do self-breast exams.  Colonoscopy: colonoscopy 5 years ago with polyps/ abnormalities with Dr. Gustavo Lah. Repeat due after 5 years. Pt doesn't remember where it was last done but wants ref.   Tobacco use: The patient denies current or previous tobacco use. Alcohol use: social drinker Exercise: moderately active  She does get adequate calcium and Vitamin D in her diet.  Labs with PCP.  Pt needs RF on singulair for allergies. Uses OTC flonase.    Past Medical History:  Diagnosis Date  . Allergy   . Depression   . Dysplasia of cervix, low grade (CIN 1) 2009  . Insomnia     Past Surgical History:  Procedure Laterality Date  . CHOLECYSTECTOMY  1989  . COLONOSCOPY  2013   polyp; repeat in 5 yrs.  Marland Kitchen FOOT SURGERY Left 2006  . TUBAL LIGATION    . VAGINA SURGERY  1989    Family History   Problem Relation Age of Onset  . Cancer Father     Social History   Socioeconomic History  . Marital status: Married    Spouse name: Not on file  . Number of children: Not on file  . Years of education: Not on file  . Highest education level: Not on file  Social Needs  . Financial resource strain: Not on file  . Food insecurity - worry: Not on file  . Food insecurity - inability: Not on file  . Transportation needs - medical: Not on file  . Transportation needs - non-medical: Not on file  Occupational History  . Not on file  Tobacco Use  . Smoking status: Former Smoker    Years: 8.00    Types: Cigarettes  . Smokeless tobacco: Never Used  . Tobacco comment: QUIT IN 1980'S  Substance and Sexual Activity  . Alcohol use: Yes    Alcohol/week: 0.0 oz    Comment: OCCASIONALLY  . Drug use: No  . Sexual activity: Not on file  Other Topics Concern  . Not on file  Social History Narrative  . Not on file    Current Meds  Medication Sig  . cetirizine (ZYRTEC) 10 MG tablet Take 10 mg by mouth daily.  . fluticasone (FLONASE) 50 MCG/ACT nasal spray Place into both nostrils daily.  Marland Kitchen ibuprofen (ADVIL,MOTRIN) 600 MG tablet Take 1 tablet (600  mg total) by mouth every 6 (six) hours as needed for mild pain or cramping.  Marland Kitchen MELATONIN PO Take 10 mg by mouth at bedtime as needed.  . montelukast (SINGULAIR) 10 MG tablet Take 1 tablet (10 mg total) as needed by mouth.  . Multiple Minerals-Vitamins (CITRACAL PLUS PO) Take 1 tablet by mouth daily.  Marland Kitchen triamcinolone (NASACORT ALLERGY 24HR) 55 MCG/ACT AERO nasal inhaler Place 2 sprays into the nose daily.  . [DISCONTINUED] montelukast (SINGULAIR) 10 MG tablet Take 10 mg by mouth as needed.      ROS:  Review of Systems  Constitutional: Negative for fatigue, fever and unexpected weight change.  Respiratory: Negative for cough, shortness of breath and wheezing.   Cardiovascular: Negative for chest pain, palpitations and leg swelling.    Gastrointestinal: Negative for blood in stool, constipation, diarrhea, nausea and vomiting.  Endocrine: Negative for cold intolerance, heat intolerance and polyuria.  Genitourinary: Positive for dyspareunia. Negative for dysuria, flank pain, frequency, genital sores, hematuria, menstrual problem, pelvic pain, urgency, vaginal bleeding, vaginal discharge and vaginal pain.  Musculoskeletal: Negative for back pain, joint swelling and myalgias.  Skin: Negative for rash.  Neurological: Negative for dizziness, syncope, light-headedness, numbness and headaches.  Hematological: Negative for adenopathy.  Psychiatric/Behavioral: Negative for agitation, confusion, sleep disturbance and suicidal ideas. The patient is not nervous/anxious.      Objective: BP 122/80   Pulse 88   Ht 5\' 6"  (1.676 m)   Wt 160 lb (72.6 kg)   BMI 25.82 kg/m    Physical Exam  Constitutional: She is oriented to person, place, and time. She appears well-developed and well-nourished.  Genitourinary: Vagina normal and uterus normal. There is no rash or tenderness on the right labia. There is no rash or tenderness on the left labia. No erythema or tenderness in the vagina. No vaginal discharge found. Right adnexum does not display mass and does not display tenderness. Left adnexum does not display mass and does not display tenderness. Cervix does not exhibit motion tenderness or polyp. Uterus is not enlarged or tender.  Neck: Normal range of motion. No thyromegaly present.  Cardiovascular: Normal rate, regular rhythm and normal heart sounds.  No murmur heard. Pulmonary/Chest: Effort normal and breath sounds normal. Right breast exhibits no mass, no nipple discharge, no skin change and no tenderness. Left breast exhibits no mass, no nipple discharge, no skin change and no tenderness.  Abdominal: Soft. There is no tenderness. There is no guarding.  Musculoskeletal: Normal range of motion.  Neurological: She is alert and oriented  to person, place, and time. No cranial nerve deficit.  Psychiatric: She has a normal mood and affect. Her behavior is normal.  Vitals reviewed.   Assessment/Plan:  Encounter for annual routine gynecological examination  Cervical cancer screening - Plan: IGP, Aptima HPV  Screening for HPV (human papillomavirus) - Plan: IGP, Aptima HPV  Screening for breast cancer - Pt has mammo today.  Postmenopausal atrophic vaginitis - Try vag ERT. Rx premarin vag crm. Coupon card. F/u prn - Plan: conjugated estrogens (PREMARIN) vaginal cream  Seasonal allergic rhinitis, unspecified trigger - Rx RF singulair. - Plan: montelukast (SINGULAIR) 10 MG tablet  Insomnia, unspecified type - Rx RF zolpidem. Use sparingly. - Plan: zolpidem (AMBIEN) 5 MG tablet  Screening for colon cancer - Pt due for repeat colonoscopy. Refer to Dr. Gustavo Lah again. - Plan: Ambulatory referral to Gastroenterology   Meds ordered this encounter  Medications  . DISCONTD: zolpidem (AMBIEN) 5 MG tablet    Sig:  Take by mouth.  . conjugated estrogens (PREMARIN) vaginal cream    Sig: Insert 1 g vaginally nightly for 1 wk, then 1 g once weekly as maintenance    Dispense:  30 g    Refill:  2  . montelukast (SINGULAIR) 10 MG tablet    Sig: Take 1 tablet (10 mg total) as needed by mouth.    Dispense:  90 tablet    Refill:  3  . zolpidem (AMBIEN) 5 MG tablet    Sig: Take 1 tablet (5 mg total) at bedtime as needed by mouth for sleep.    Dispense:  30 tablet    Refill:  0           GYN counsel mammography screening, menopause, adequate intake of calcium and vitamin D, diet and exercise    F/U  Return in about 1 year (around 11/15/2017).  Alicia B. Copland, PA-C 11/15/2016 9:17 AM

## 2016-11-16 ENCOUNTER — Other Ambulatory Visit: Payer: Self-pay | Admitting: Obstetrics and Gynecology

## 2016-11-16 DIAGNOSIS — R928 Other abnormal and inconclusive findings on diagnostic imaging of breast: Secondary | ICD-10-CM

## 2016-11-16 DIAGNOSIS — N6320 Unspecified lump in the left breast, unspecified quadrant: Secondary | ICD-10-CM

## 2016-11-19 ENCOUNTER — Encounter: Payer: Self-pay | Admitting: Obstetrics and Gynecology

## 2016-11-19 LAB — IGP, APTIMA HPV
HPV Aptima: NEGATIVE
PAP Smear Comment: 0

## 2016-12-03 ENCOUNTER — Other Ambulatory Visit: Payer: Self-pay | Admitting: Obstetrics and Gynecology

## 2016-12-03 ENCOUNTER — Ambulatory Visit
Admission: RE | Admit: 2016-12-03 | Discharge: 2016-12-03 | Disposition: A | Discharge status: Home / Self Care | Payer: BLUE CROSS/BLUE SHIELD | Source: Ambulatory Visit | Attending: Obstetrics and Gynecology | Admitting: Obstetrics and Gynecology

## 2016-12-03 DIAGNOSIS — N6002 Solitary cyst of left breast: Secondary | ICD-10-CM

## 2016-12-03 DIAGNOSIS — R922 Inconclusive mammogram: Secondary | ICD-10-CM | POA: Diagnosis not present

## 2016-12-03 DIAGNOSIS — N6320 Unspecified lump in the left breast, unspecified quadrant: Secondary | ICD-10-CM | POA: Diagnosis not present

## 2016-12-03 DIAGNOSIS — R928 Other abnormal and inconclusive findings on diagnostic imaging of breast: Secondary | ICD-10-CM

## 2016-12-03 DIAGNOSIS — N6321 Unspecified lump in the left breast, upper outer quadrant: Secondary | ICD-10-CM | POA: Diagnosis not present

## 2016-12-04 ENCOUNTER — Telehealth: Payer: Self-pay | Admitting: Obstetrics and Gynecology

## 2016-12-04 MED ORDER — FLUCONAZOLE 150 MG PO TABS: 150.0000 mg | ORAL_TABLET | Freq: Once | ORAL | 0 refills | Status: AC

## 2016-12-04 NOTE — Telephone Encounter (Signed)
Pt aware of cat 4 mammo. Orders signed for aspiration vs bx. Questions answered. FH fibrocystic breast dz.  Pt also with vaginal itching since yesterday. Had yeast on pap and now having sx. Rx diflucan eRxd. F/u prn.

## 2016-12-10 ENCOUNTER — Ambulatory Visit
Admission: RE | Admit: 2016-12-10 | Discharge: 2016-12-10 | Disposition: A | Discharge status: Home / Self Care | Payer: BLUE CROSS/BLUE SHIELD | Source: Ambulatory Visit | Attending: Obstetrics and Gynecology | Admitting: Obstetrics and Gynecology

## 2016-12-10 ENCOUNTER — Other Ambulatory Visit: Payer: Self-pay | Admitting: Obstetrics and Gynecology

## 2016-12-10 DIAGNOSIS — R928 Other abnormal and inconclusive findings on diagnostic imaging of breast: Secondary | ICD-10-CM

## 2016-12-10 DIAGNOSIS — N6002 Solitary cyst of left breast: Secondary | ICD-10-CM

## 2016-12-10 DIAGNOSIS — N6012 Diffuse cystic mastopathy of left breast: Secondary | ICD-10-CM | POA: Diagnosis not present

## 2016-12-10 DIAGNOSIS — N6321 Unspecified lump in the left breast, upper outer quadrant: Secondary | ICD-10-CM | POA: Diagnosis not present

## 2016-12-10 HISTORY — PX: BREAST BIOPSY: SHX20

## 2016-12-11 LAB — SURGICAL PATHOLOGY

## 2016-12-12 ENCOUNTER — Encounter: Payer: Self-pay | Admitting: Obstetrics and Gynecology

## 2017-01-08 HISTORY — PX: COLONOSCOPY: SHX174

## 2017-02-04 DIAGNOSIS — I87393 Chronic venous hypertension (idiopathic) with other complications of bilateral lower extremity: Secondary | ICD-10-CM | POA: Diagnosis not present

## 2017-02-13 DIAGNOSIS — Z86010 Personal history of colonic polyps: Secondary | ICD-10-CM | POA: Diagnosis not present

## 2017-03-11 DIAGNOSIS — K648 Other hemorrhoids: Secondary | ICD-10-CM | POA: Diagnosis not present

## 2017-03-11 DIAGNOSIS — Z86010 Personal history of colonic polyps: Secondary | ICD-10-CM | POA: Diagnosis not present

## 2017-03-11 DIAGNOSIS — K640 First degree hemorrhoids: Secondary | ICD-10-CM | POA: Diagnosis not present

## 2017-03-29 DIAGNOSIS — Z23 Encounter for immunization: Secondary | ICD-10-CM | POA: Diagnosis not present

## 2017-10-14 DIAGNOSIS — Z23 Encounter for immunization: Secondary | ICD-10-CM | POA: Diagnosis not present

## 2017-10-31 ENCOUNTER — Other Ambulatory Visit: Payer: Self-pay | Admitting: Obstetrics and Gynecology

## 2017-10-31 DIAGNOSIS — Z1231 Encounter for screening mammogram for malignant neoplasm of breast: Secondary | ICD-10-CM

## 2017-11-12 ENCOUNTER — Ambulatory Visit: Payer: BLUE CROSS/BLUE SHIELD | Admitting: Obstetrics and Gynecology

## 2017-11-19 ENCOUNTER — Ambulatory Visit (INDEPENDENT_AMBULATORY_CARE_PROVIDER_SITE_OTHER): Payer: BLUE CROSS/BLUE SHIELD | Admitting: Obstetrics and Gynecology

## 2017-11-19 ENCOUNTER — Ambulatory Visit
Admission: RE | Admit: 2017-11-19 | Discharge: 2017-11-19 | Disposition: A | Discharge status: Home / Self Care | Payer: BLUE CROSS/BLUE SHIELD | Source: Ambulatory Visit | Attending: Obstetrics and Gynecology | Admitting: Obstetrics and Gynecology

## 2017-11-19 ENCOUNTER — Encounter: Payer: Self-pay | Admitting: Obstetrics and Gynecology

## 2017-11-19 VITALS — BP 168/80 | HR 102 | Ht 66.0 in | Wt 166.0 lb

## 2017-11-19 DIAGNOSIS — N952 Postmenopausal atrophic vaginitis: Secondary | ICD-10-CM

## 2017-11-19 DIAGNOSIS — Z01419 Encounter for gynecological examination (general) (routine) without abnormal findings: Secondary | ICD-10-CM | POA: Diagnosis not present

## 2017-11-19 DIAGNOSIS — Z1231 Encounter for screening mammogram for malignant neoplasm of breast: Secondary | ICD-10-CM | POA: Insufficient documentation

## 2017-11-19 DIAGNOSIS — Z800 Family history of malignant neoplasm of digestive organs: Secondary | ICD-10-CM | POA: Insufficient documentation

## 2017-11-19 DIAGNOSIS — Z1239 Encounter for other screening for malignant neoplasm of breast: Secondary | ICD-10-CM

## 2017-11-19 DIAGNOSIS — Z713 Dietary counseling and surveillance: Secondary | ICD-10-CM

## 2017-11-19 NOTE — Progress Notes (Signed)
PCP: Maryland Pink, MD   Chief Complaint  Patient presents with  . Gynecologic Exam    HPI:      [name] is a 57 y.o. [id number] who LMP was No LMP recorded. Patient is postmenopausal., presents today for her annual examination.  Her menses are absent due to menopause. She does not have intermenstrual bleeding. She is s/p D&C/polypectomy/hysteroscopy 9/17 for PMB. PMB resolved after procedure.  She does have night sweats that are tolerable. She also has occas insomnia and took zolpidem sparingly, when absolutely needed but she doesn't want to continue it. Takes melatonin some.   Sex activity: rarely with single partner, contraception - post menopausal status. She does have vaginal dryness. She has tried lubricants without relief. Used vag ERT in the past with sx relief but doesn't want it again.  Last Pap: 11/15/16 Results were: no abnormalities /neg HPV DNA.  Hx of STDs: none  Last mammogram: 12/03/16  Results were: cat 4 LT breast; pt had PASH on bx. Repeat mammo due after 12 months and has Mammo appt today. There is no FH of breast cancer. There is no FH of ovarian cancer. Father had pancreatic cancer. The patient does do self-breast exams.  Colonoscopy: colonoscopy 2/19 without abnormalities with Dr. Gustavo Lah. Repeat due after 10 years.   Tobacco use: The patient denies current or previous tobacco use. Alcohol use: social drinker Exercise: moderately active  She does get adequate calcium and Vitamin D in her diet.  Labs with PCP.    Past Medical History:  Diagnosis Date  . Abnormal mammogram 11/2016   neg bx  . Allergy   . Depression   . Dysplasia of cervix, low grade (CIN 1) 2009  . Insomnia     Past Surgical History:  Procedure Laterality Date  . BREAST BIOPSY Left 12/10/2016   FIBROCYSTIC CHANGE--at [facility]  . CHOLECYSTECTOMY  1989  . COLONOSCOPY  2013   polyp; repeat in 5 yrs.  . COLONOSCOPY  2019  . DILATATION & CURETTAGE/HYSTEROSCOPY WITH MYOSURE  N/A 09/27/2015   Procedure: DILATATION & CURETTAGE/HYSTEROSCOPY WITH MYOSURE;  Surgeon: Will Bonnet, MD;  Location: [facility] ORS;  Service: Gynecology;  Laterality: N/A;  . FOOT SURGERY Left 2006  . TUBAL LIGATION    . VAGINA SURGERY  1989    Family History  Problem Relation Age of Onset  . Congestive Heart Failure Mother   . Diabetes Mother        DM Type 2  . Pancreatic cancer Father 10  . Hypertension Father   . Stomach cancer Maternal Grandmother   . Bone cancer Maternal Uncle 63  . Throat cancer Maternal Uncle 65  . Breast cancer Neg Hx     Social History   Socioeconomic History  . Marital status: Married    Spouse name: Not on file  . Number of children: Not on file  . Years of education: Not on file  . Highest education level: Not on file  Occupational History  . Not on file  Social Needs  . Financial resource strain: Not on file  . Food insecurity:    Worry: Not on file    Inability: Not on file  . Transportation needs:    Medical: Not on file    Non-medical: Not on file  Tobacco Use  . Smoking status: Former Smoker    Years: 8.00    Types: Cigarettes  . Smokeless tobacco: Never Used  . Tobacco comment: QUIT IN 1980'S  Substance and Sexual Activity  . Alcohol use: Yes    Alcohol/week: 0.0 standard drinks    Comment: OCCASIONALLY  . Drug use: No  . Sexual activity: Yes    Birth control/protection: Post-menopausal  Lifestyle  . Physical activity:    Days per week: Not on file    Minutes per session: Not on file  . Stress: Not on file  Relationships  . Social connections:    Talks on phone: Not on file    Gets together: Not on file    Attends religious service: Not on file    Active member of club or organization: Not on file    Attends meetings of clubs or organizations: Not on file    Relationship status: Not on file  . Intimate partner violence:    Fear of current or ex partner: Not on file    Emotionally abused: Not on file    Physically  abused: Not on file    Forced sexual activity: Not on file  Other Topics Concern  . Not on file  Social History Narrative  . Not on file    Current Meds  Medication Sig  . cetirizine (ZYRTEC) 10 MG tablet Take 10 mg by mouth daily.  . Fish Oil-Cholecalciferol (FISH OIL + D3 PO) Take by mouth.  . fluticasone (FLONASE) 50 MCG/ACT nasal spray Place into both nostrils daily.  Marland Kitchen ibuprofen (ADVIL,MOTRIN) 600 MG tablet Take 1 tablet (600 mg total) by mouth every 6 (six) hours as needed for mild pain or cramping.  . Multiple Minerals-Vitamins (CITRACAL PLUS PO) Take 1 tablet by mouth daily.  Marland Kitchen triamcinolone (NASACORT ALLERGY 24HR) 55 MCG/ACT AERO nasal inhaler Place 2 sprays into the nose daily.      ROS:  Review of Systems  Constitutional: Negative for fatigue, fever and unexpected weight change.  Respiratory: Negative for cough, shortness of breath and wheezing.   Cardiovascular: Negative for chest pain, palpitations and leg swelling.  Gastrointestinal: Negative for blood in stool, constipation, diarrhea, nausea and vomiting.  Endocrine: Negative for cold intolerance, heat intolerance and polyuria.  Genitourinary: Positive for dyspareunia. Negative for dysuria, flank pain, frequency, genital sores, hematuria, menstrual problem, pelvic pain, urgency, vaginal bleeding, vaginal discharge and vaginal pain.  Musculoskeletal: Negative for back pain, joint swelling and myalgias.  Skin: Negative for rash.  Neurological: Negative for dizziness, syncope, light-headedness, numbness and headaches.  Hematological: Negative for adenopathy.  Psychiatric/Behavioral: Negative for agitation, confusion, sleep disturbance and suicidal ideas. The patient is not nervous/anxious.      Objective: BP (!) 168/80   Pulse (!) 102   Ht 5\' 6"  (1.676 m)   Wt 166 lb (75.3 kg)   BMI 26.79 kg/m    Physical Exam  Constitutional: She is oriented to person, place, and time. She appears well-developed and  well-nourished.  Genitourinary: Vagina normal and uterus normal. There is no rash or tenderness on the right labia. There is no rash or tenderness on the left labia. No erythema or tenderness in the vagina. No vaginal discharge found. Right adnexum does not display mass and does not display tenderness. Left adnexum does not display mass and does not display tenderness. Cervix does not exhibit motion tenderness or polyp. Uterus is not enlarged or tender.  Neck: Normal range of motion. No thyromegaly present.  Cardiovascular: Normal rate, regular rhythm and normal heart sounds.  No murmur heard. Pulmonary/Chest: Effort normal and breath sounds normal. Right breast exhibits no mass, no nipple discharge, no skin change and  no tenderness. Left breast exhibits no mass, no nipple discharge, no skin change and no tenderness.  Abdominal: Soft. There is no tenderness. There is no guarding.  Musculoskeletal: Normal range of motion.  Neurological: She is alert and oriented to person, place, and time. No cranial nerve deficit.  Psychiatric: She has a normal mood and affect. Her behavior is normal.  Vitals reviewed.   Assessment/Plan:  Encounter for annual routine gynecological examination  Screening for breast cancer - Pt has mammo today.   Postmenopausal atrophic vaginitis - Pt declines vag ERT tx. Cont lubricants. F/u prn.  Weight loss counseling, encounter for - MyFitness Pal app/diet changes. F/u with PCP or one of our MDs for wt loss Rx (per pt request). Discussed phentermine. Pt wants to f/u with PCP.  Family history of pancreatic cancer - MyRisk testing discussed. Pt to f/u if desires.          GYN counsel mammography screening, menopause, adequate intake of calcium and vitamin D, diet and exercise    F/U  Return in about 1 year (around 11/20/2018).   B. , PA-C 11/19/2017 4:00 PM

## 2017-11-19 NOTE — Patient Instructions (Signed)
I value your feedback and entrusting us with your care. If you get a [facility] patient survey, I would appreciate you taking the time to let us know about your experience today. Thank you! 

## 2017-11-20 ENCOUNTER — Encounter: Payer: Self-pay | Admitting: Obstetrics and Gynecology

## 2017-12-03 DIAGNOSIS — H524 Presbyopia: Secondary | ICD-10-CM | POA: Diagnosis not present

## 2017-12-27 DIAGNOSIS — J069 Acute upper respiratory infection, unspecified: Secondary | ICD-10-CM | POA: Diagnosis not present

## 2018-01-18 IMAGING — US US BREAST*L* LIMITED INC AXILLA
1 series · 7 of 7 positions shown · non-contrast
Comparison: Previous exam(s).

CLINICAL DATA: Left superior breast nodule seen on most recent
screening mammography.

EXAM:
2D DIGITAL DIAGNOSTIC LEFT MAMMOGRAM WITH CAD AND ADJUNCT TOMO
ULTRASOUND LEFT BREAST

[Series 1: us breast*left* limited inc axilla · 0.05mm/px · 7 of 7 slices shown]
[im 1/7]
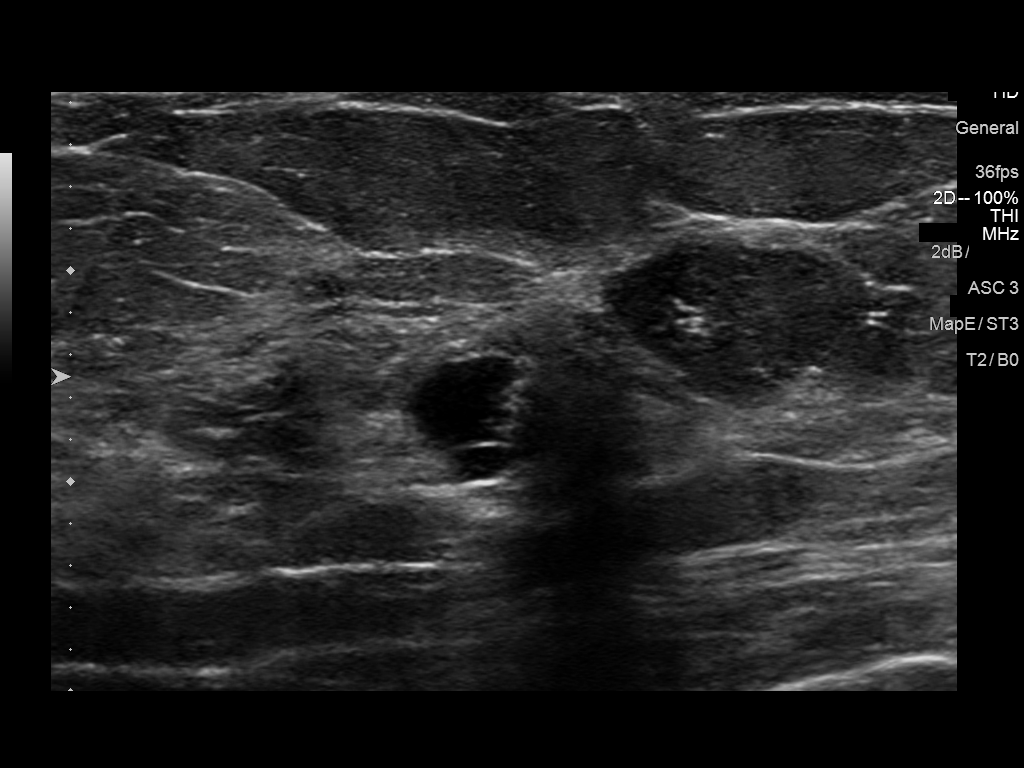
[im 2/7]
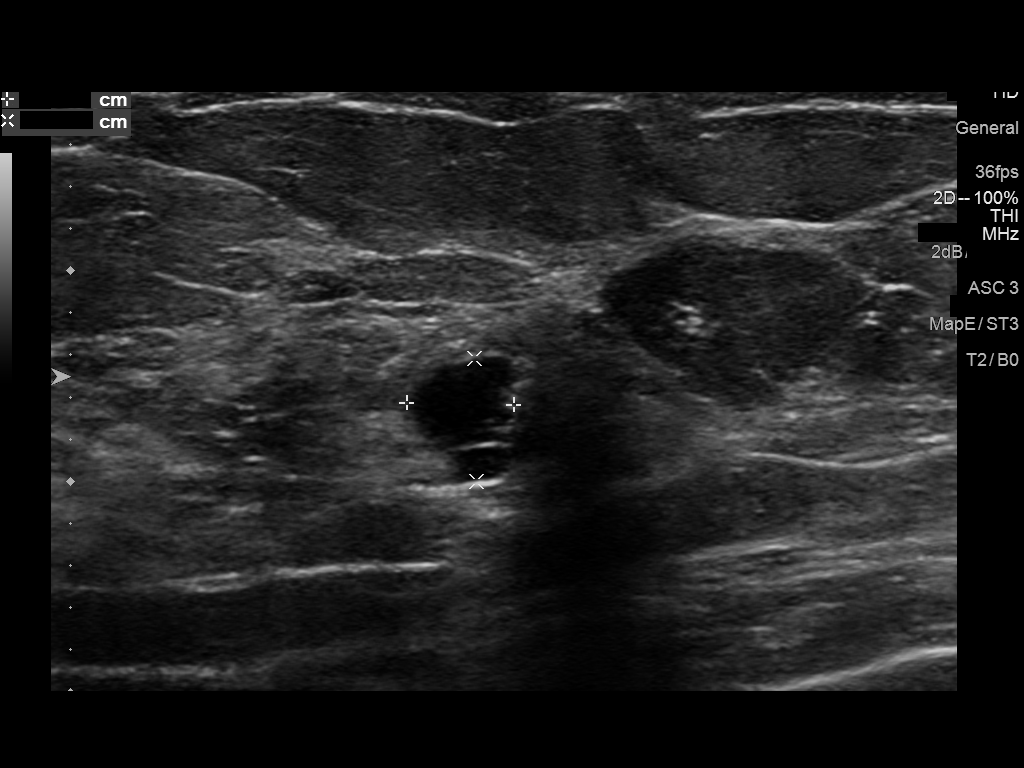
[im 3/7]
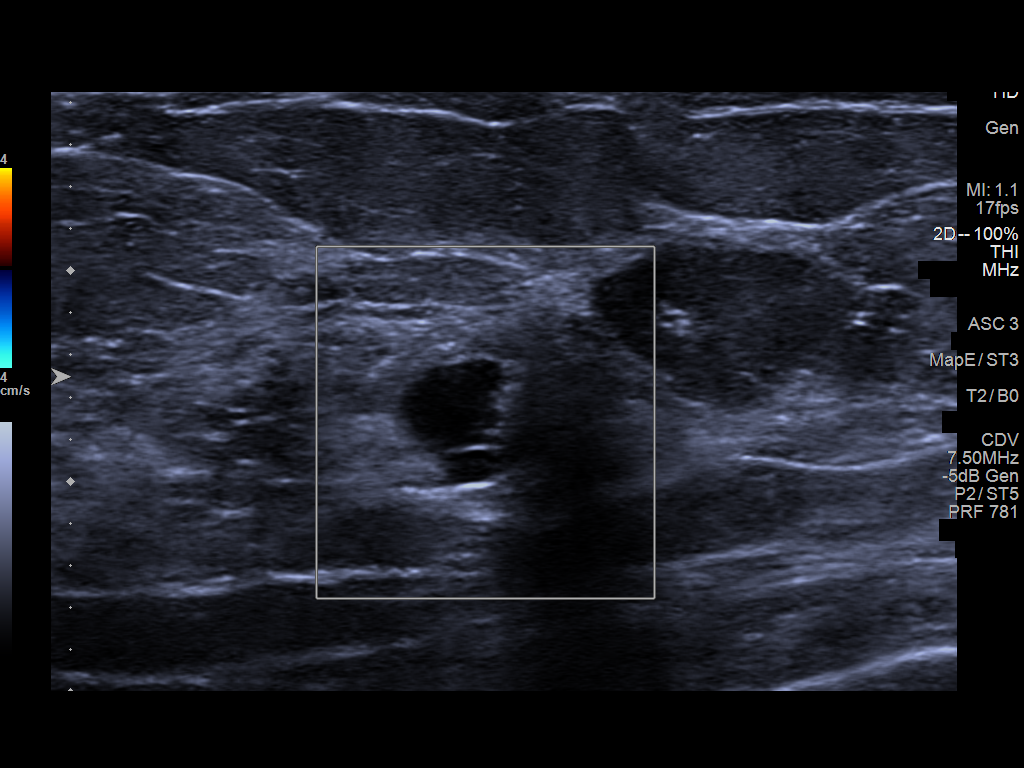
[im 4/7]
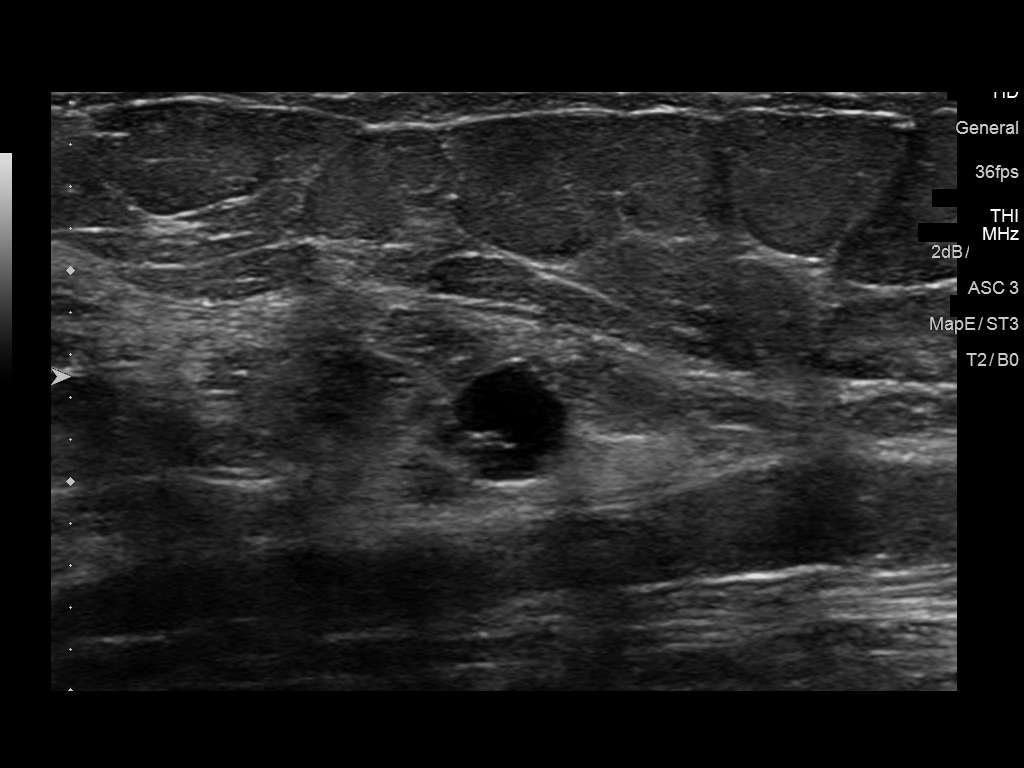
[im 5/7]
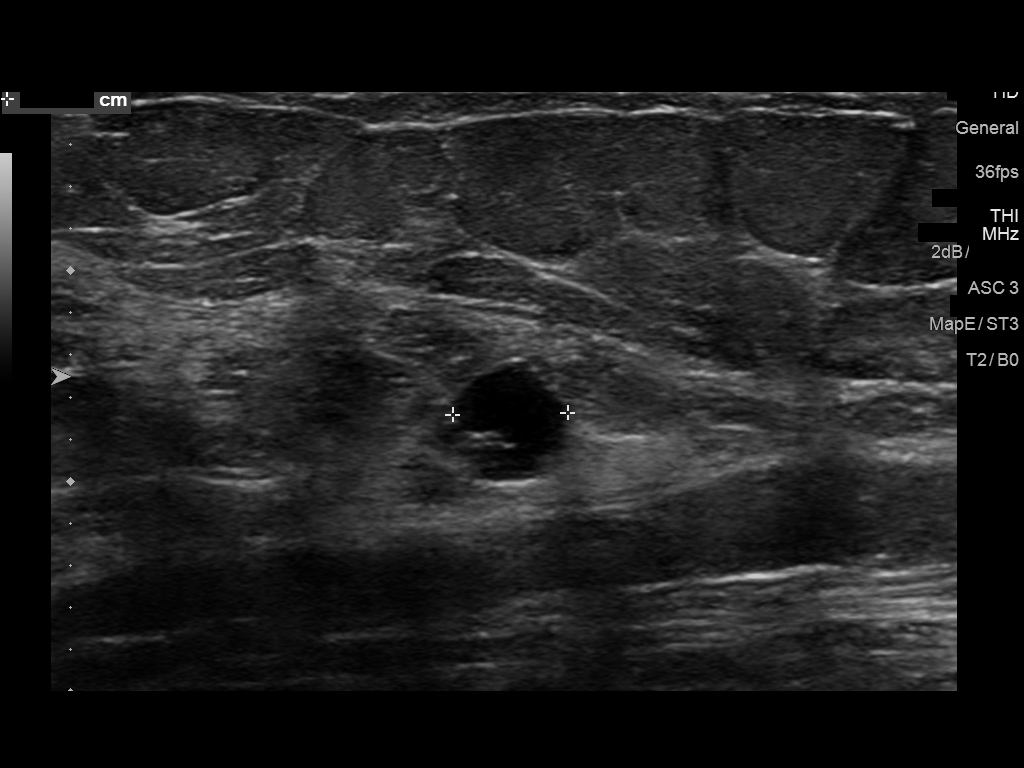
[im 6/7]
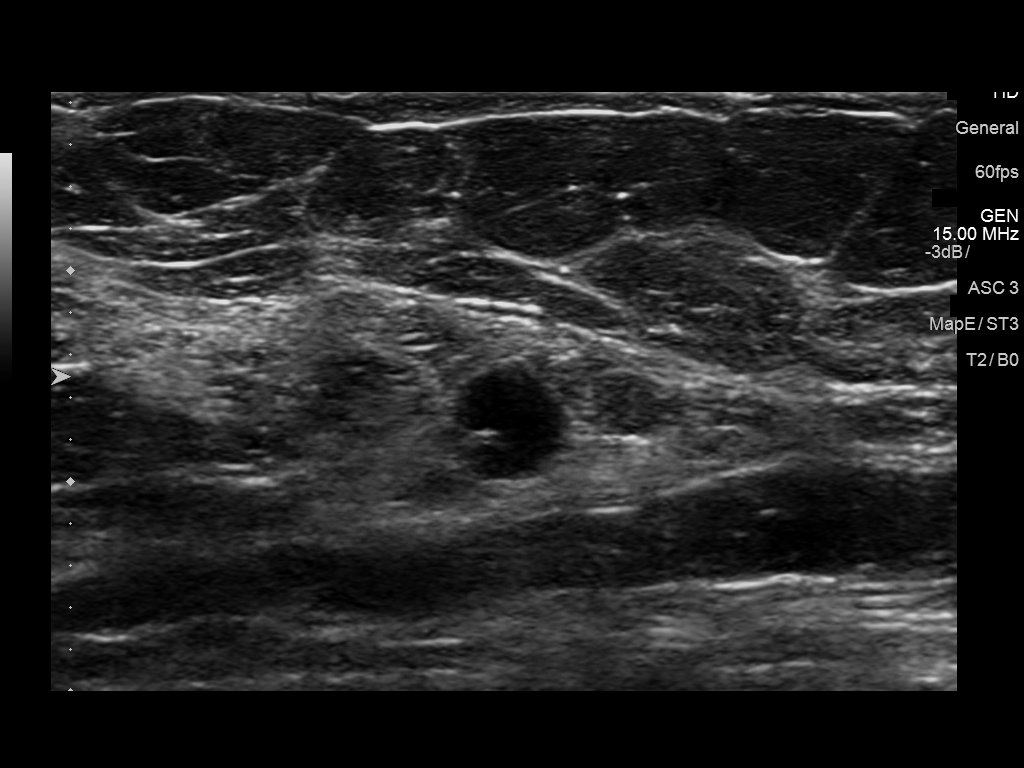
[im 7/7]
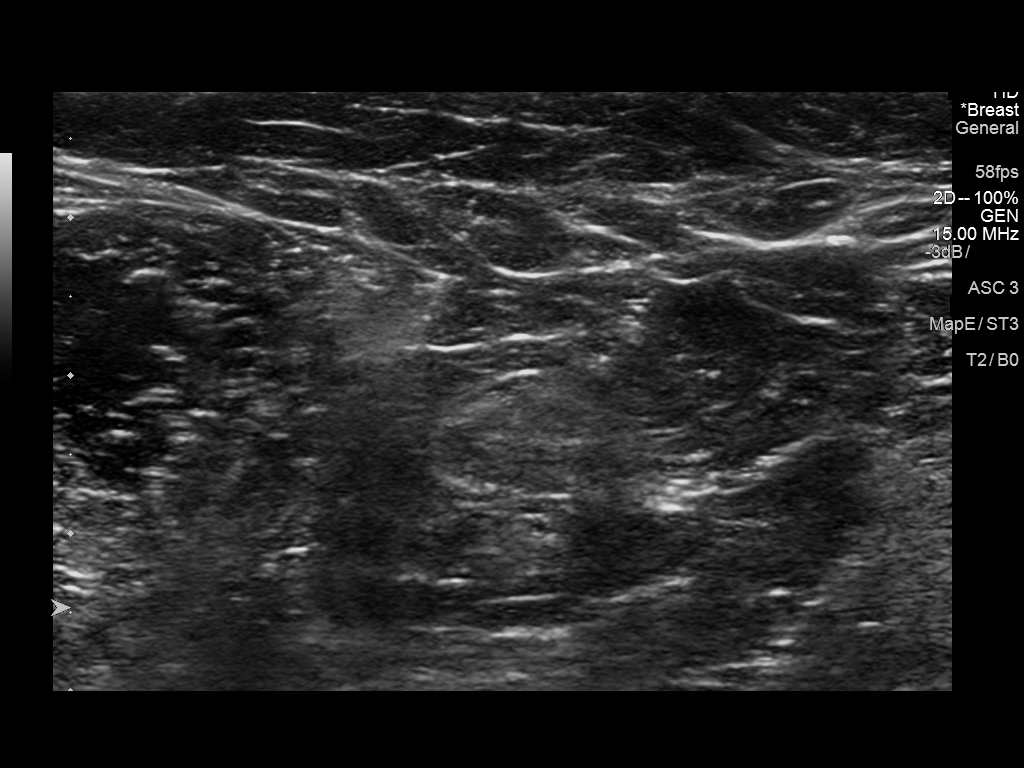

[7 of 7 positions shown; findings below may reference images not displayed]

ACR Breast Density Category c: The breast tissue is heterogeneously
dense, which may obscure small masses.
FINDINGS: Additional mammographic views of the left breast confirm presence of
circumscribed 7 mm nodule in the left breast upper outer quadrant,
far posterior depth.

Mammographic images were processed with CAD.

On physical exam, no suspicious masses are palpated.

Targeted ultrasound is performed, showing left breast 230 o'clock 8
cm from the nipple slightly irregular hypoechoic to anechoic mass
which measures 0.6 x 0.5 x 0.6 cm. This finding corresponds to the
mammographically seen nodule. No evidence of left axillary
lymphadenopathy.
IMPRESSION: Left breast 230 o'clock complicated cyst versus cystic nodule, for
which cyst aspiration versus core needle biopsy is recommended.

RECOMMENDATION:
Left breast cyst aspiration versus core needle biopsy of 230 o'clock
nodule.

I have discussed the findings and recommendations with the patient.
Results were also provided in writing at the conclusion of the
visit. If applicable, a reminder letter will be sent to the patient
regarding the next appointment.

BI-RADS CATEGORY  4: Suspicious.

## 2018-02-25 DIAGNOSIS — J40 Bronchitis, not specified as acute or chronic: Secondary | ICD-10-CM | POA: Diagnosis not present

## 2018-08-14 DIAGNOSIS — M7731 Calcaneal spur, right foot: Secondary | ICD-10-CM | POA: Diagnosis not present

## 2018-08-14 DIAGNOSIS — M722 Plantar fascial fibromatosis: Secondary | ICD-10-CM | POA: Diagnosis not present

## 2018-08-14 DIAGNOSIS — M79671 Pain in right foot: Secondary | ICD-10-CM | POA: Diagnosis not present

## 2018-09-01 DIAGNOSIS — F4321 Adjustment disorder with depressed mood: Secondary | ICD-10-CM | POA: Diagnosis not present

## 2018-09-10 ENCOUNTER — Other Ambulatory Visit: Payer: Self-pay | Admitting: Family Medicine

## 2018-09-10 DIAGNOSIS — Z1231 Encounter for screening mammogram for malignant neoplasm of breast: Secondary | ICD-10-CM

## 2018-10-17 DIAGNOSIS — Z23 Encounter for immunization: Secondary | ICD-10-CM | POA: Diagnosis not present

## 2018-11-23 NOTE — Progress Notes (Addendum)
PCP: Maryland Pink, MD   Chief Complaint  Patient presents with  . Gynecologic Exam    little lump on left breast, noticed it about 2 weeks ago not tender/painful    HPI:      [name] is a 58 y.o. [id number] who LMP was No LMP recorded. Patient is postmenopausal., presents today for her annual examination.  Her menses are absent due to menopause. She does not have intermenstrual bleeding. She is s/p D&C/polypectomy/hysteroscopy 9/17 for PMB. PMB resolved after procedure.  Sex activity: rarely with single partner, contraception - post menopausal status. She does have vaginal dryness. She has tried lubricants without relief. Used vag ERT in the past with sx relief and would like to try it again.  Last Pap: 11/15/16 Results were: no abnormalities /neg HPV DNA.  Hx of STDs: none  Last mammogram: 11/19/17  Results were: normal repeat in 12 months. Pt has appt today. Hx of LT breast [place] on bx 12/18. Noticed LT breast mass 2 wks ago on SBE. No change in size, non-tender, no erythema/trauma.  There is no FH of breast cancer. There is no FH of ovarian cancer. Father had pancreatic cancer, pt declined cancer genetic testing last yr. The patient does do self-breast exams.  Colonoscopy: colonoscopy 2/19 without abnormalities with Dr. Gustavo Lah. Repeat due after 10 years.   Tobacco use: The patient denies current or previous tobacco use. Alcohol use: social drinker  No drug use.  Exercise: moderately active  She does get adequate calcium and Vitamin D in her diet.  No recent fasting labs.    Past Medical History:  Diagnosis Date  . Abnormal mammogram 11/2016   PASH LT breast  . Allergy   . Depression   . Dysplasia of cervix, low grade (CIN 1) 2009  . Insomnia     Past Surgical History:  Procedure Laterality Date  . BREAST BIOPSY Left 12/10/2016   [place] bx FIBROCYSTIC CHANGE, PASH  . CHOLECYSTECTOMY  1989  . COLONOSCOPY  2013   polyp; repeat in 5 yrs.  . COLONOSCOPY  2019   . DILATATION & CURETTAGE/HYSTEROSCOPY WITH MYOSURE N/A 09/27/2015   Procedure: DILATATION & CURETTAGE/HYSTEROSCOPY WITH MYOSURE;  Surgeon: Will Bonnet, MD;  Location: [facility] ORS;  Service: Gynecology;  Laterality: N/A;  . FOOT SURGERY Left 2006  . TUBAL LIGATION    . VAGINA SURGERY  1989    Family History  Problem Relation Age of Onset  . Congestive Heart Failure Mother   . Diabetes Mother        DM Type 2  . Pancreatic cancer Father 76  . Hypertension Father   . Stomach cancer Maternal Grandmother   . Bone cancer Maternal Uncle 63  . Throat cancer Maternal Uncle 65  . Breast cancer Neg Hx     Social History   Socioeconomic History  . Marital status: Married    Spouse name: Not on file  . Number of children: Not on file  . Years of education: Not on file  . Highest education level: Not on file  Occupational History  . Not on file  Social Needs  . Financial resource strain: Not on file  . Food insecurity    Worry: Not on file    Inability: Not on file  . Transportation needs    Medical: Not on file    Non-medical: Not on file  Tobacco Use  . Smoking status: Former Smoker    Years: 8.00  Types: Cigarettes  . Smokeless tobacco: Never Used  . Tobacco comment: QUIT IN 1980'S  Substance and Sexual Activity  . Alcohol use: Yes    Alcohol/week: 0.0 standard drinks    Comment: OCCASIONALLY  . Drug use: No  . Sexual activity: Yes    Birth control/protection: Post-menopausal  Lifestyle  . Physical activity    Days per week: Not on file    Minutes per session: Not on file  . Stress: Not on file  Relationships  . Social Herbalist on phone: Not on file    Gets together: Not on file    Attends religious service: Not on file    Active member of club or organization: Not on file    Attends meetings of clubs or organizations: Not on file    Relationship status: Not on file  . Intimate partner violence    Fear of current or ex partner: Not on file     Emotionally abused: Not on file    Physically abused: Not on file    Forced sexual activity: Not on file  Other Topics Concern  . Not on file  Social History Narrative  . Not on file    Current Meds  Medication Sig  . Ascorbic Acid (VITAMIN C PO) Take by mouth.  . cetirizine (ZYRTEC) 10 MG tablet Take 10 mg by mouth daily.  . Fish Oil-Cholecalciferol (FISH OIL + D3 PO) Take by mouth.  . fluticasone (FLONASE) 50 MCG/ACT nasal spray Place into both nostrils daily.  Marland Kitchen ibuprofen (ADVIL,MOTRIN) 600 MG tablet Take 1 tablet (600 mg total) by mouth every 6 (six) hours as needed for mild pain or cramping.  . Misc Natural Products (IN-FLA-MEND) CAPS Take by mouth.  . Multiple Minerals-Vitamins (CITRACAL PLUS PO) Take 1 tablet by mouth daily.  Marland Kitchen VITAMIN D PO Take by mouth.      ROS:  Review of Systems  Constitutional: Negative for fatigue, fever and unexpected weight change.  Respiratory: Negative for cough, shortness of breath and wheezing.   Cardiovascular: Negative for chest pain, palpitations and leg swelling.  Gastrointestinal: Negative for blood in stool, constipation, diarrhea, nausea and vomiting.  Endocrine: Negative for cold intolerance, heat intolerance and polyuria.  Genitourinary: Positive for dyspareunia. Negative for dysuria, flank pain, frequency, genital sores, hematuria, menstrual problem, pelvic pain, urgency, vaginal bleeding, vaginal discharge and vaginal pain.  Musculoskeletal: Negative for back pain, joint swelling and myalgias.  Skin: Negative for rash.  Neurological: Negative for dizziness, syncope, light-headedness, numbness and headaches.  Hematological: Negative for adenopathy.  Psychiatric/Behavioral: Negative for agitation, confusion, sleep disturbance and suicidal ideas. The patient is not nervous/anxious.   BREAST: Mass   Objective: BP 130/90   Ht 5\' 6"  (1.676 m)   Wt 165 lb (74.8 kg)   BMI 26.63 kg/m    Physical Exam Constitutional:       Appearance: She is well-developed.  Genitourinary:     Vulva, vagina, uterus, right adnexa and left adnexa normal.     No vulval lesion or tenderness noted.     No vaginal discharge, erythema or tenderness.     No cervical motion tenderness or polyp.     Uterus is not enlarged or tender.     No right or left adnexal mass present.     Right adnexa not tender.     Left adnexa not tender.  Neck:     Musculoskeletal: Normal range of motion.     Thyroid: No thyromegaly.  Cardiovascular:     Rate and Rhythm: Normal rate and regular rhythm.     Heart sounds: Normal heart sounds. No murmur.  Pulmonary:     Effort: Pulmonary effort is normal.     Breath sounds: Normal breath sounds.  Chest:     Breasts:        Right: No mass, nipple discharge, skin change or tenderness.        Left: Mass present. No nipple discharge, skin change or tenderness.    Abdominal:     Palpations: Abdomen is soft.     Tenderness: There is no abdominal tenderness. There is no guarding.  Musculoskeletal: Normal range of motion.  Neurological:     General: No focal deficit present.     Mental Status: She is alert and oriented to person, place, and time.     Cranial Nerves: No cranial nerve deficit.  Skin:    General: Skin is warm and dry.  Psychiatric:        Mood and Affect: Mood normal.        Behavior: Behavior normal.        Thought Content: Thought content normal.        Judgment: Judgment normal.  Vitals signs reviewed.     Assessment/Plan:  Encounter for annual routine gynecological examination  Encounter for screening mammogram for malignant neoplasm of breast  Left breast mass - Plan: DIAG MAMMO BREAST BILATERAL, ULTRASOUND BREAST LEFT; Check dx mammo and u/s. Will f/u with results--lipoma on mammo/u/s.  Postmenopausal atrophic vaginitis--discussed vag ERT. Pt interested. Rx premarin vag crm eRxd. F/u prn.  Blood tests for routine general physical examination - Plan: Comprehensive metabolic  panel, Lipid panel  Screening cholesterol level - Plan: Lipid panel  Family history of pancreatic cancer--MyRisk testing discussed. Pt to f/u if desires.   Meds ordered this encounter  Medications  . conjugated estrogens (PREMARIN) vaginal cream    Sig: Insert 1 g vaginally nightly for 1 wk, then 1 g once weekly as maintenance    Dispense:  30 g    Refill:  2    Order Specific Question:   Supervising Provider    Answer:   Gae Dry [id number]           GYN counsel mammography screening, menopause, adequate intake of calcium and vitamin D, diet and exercise    F/U  Return in about 1 year (around 11/24/2019).  Tenessa Marsee B. Averyanna Sax, PA-C 11/24/2018 9:09 AM

## 2018-11-24 ENCOUNTER — Ambulatory Visit
Admission: RE | Admit: 2018-11-24 | Discharge: 2018-11-24 | Disposition: A | Discharge status: Home / Self Care | Payer: BC Managed Care – PPO | Source: Ambulatory Visit | Attending: Obstetrics and Gynecology | Admitting: Obstetrics and Gynecology

## 2018-11-24 ENCOUNTER — Ambulatory Visit
Admission: RE | Admit: 2018-11-24 | Discharge: 2018-11-24 | Disposition: A | Discharge status: Home / Self Care | Payer: BC Managed Care – PPO | Source: Ambulatory Visit | Attending: Family Medicine | Admitting: Family Medicine

## 2018-11-24 ENCOUNTER — Other Ambulatory Visit: Payer: Self-pay

## 2018-11-24 ENCOUNTER — Encounter: Payer: Self-pay | Admitting: Obstetrics and Gynecology

## 2018-11-24 ENCOUNTER — Ambulatory Visit (INDEPENDENT_AMBULATORY_CARE_PROVIDER_SITE_OTHER): Payer: BC Managed Care – PPO | Admitting: Obstetrics and Gynecology

## 2018-11-24 VITALS — BP 130/90 | Ht 66.0 in | Wt 165.0 lb

## 2018-11-24 DIAGNOSIS — N941 Unspecified dyspareunia: Secondary | ICD-10-CM

## 2018-11-24 DIAGNOSIS — Z1231 Encounter for screening mammogram for malignant neoplasm of breast: Secondary | ICD-10-CM | POA: Diagnosis not present

## 2018-11-24 DIAGNOSIS — N6320 Unspecified lump in the left breast, unspecified quadrant: Secondary | ICD-10-CM | POA: Diagnosis not present

## 2018-11-24 DIAGNOSIS — N6324 Unspecified lump in the left breast, lower inner quadrant: Secondary | ICD-10-CM | POA: Diagnosis not present

## 2018-11-24 DIAGNOSIS — R928 Other abnormal and inconclusive findings on diagnostic imaging of breast: Secondary | ICD-10-CM | POA: Diagnosis not present

## 2018-11-24 DIAGNOSIS — Z01419 Encounter for gynecological examination (general) (routine) without abnormal findings: Secondary | ICD-10-CM

## 2018-11-24 DIAGNOSIS — Z13220 Encounter for screening for lipoid disorders: Secondary | ICD-10-CM | POA: Diagnosis not present

## 2018-11-24 DIAGNOSIS — Z0000 Encounter for general adult medical examination without abnormal findings: Secondary | ICD-10-CM

## 2018-11-24 DIAGNOSIS — Z800 Family history of malignant neoplasm of digestive organs: Secondary | ICD-10-CM

## 2018-11-24 DIAGNOSIS — N952 Postmenopausal atrophic vaginitis: Secondary | ICD-10-CM

## 2018-11-24 DIAGNOSIS — H524 Presbyopia: Secondary | ICD-10-CM | POA: Diagnosis not present

## 2018-11-24 MED ORDER — PREMARIN 0.625 MG/GM VA CREA: TOPICAL_CREAM | VAGINAL | 2 refills | Status: DC

## 2018-11-24 NOTE — Patient Instructions (Signed)
I value your feedback and entrusting us with your care. If you get a [facility] patient survey, I would appreciate you taking the time to let us know about your experience today. Thank you! 

## 2018-11-24 NOTE — Addendum Note (Signed)
Addended by: Ardeth Perfect B on: [id number] 03:10 PM   Modules accepted: Orders

## 2018-11-25 LAB — COMPREHENSIVE METABOLIC PANEL
ALT: 36 IU/L — ABNORMAL HIGH (ref 0–32)
AST: 26 IU/L (ref 0–40)
Albumin/Globulin Ratio: 1.7 (ref 1.2–2.2)
Albumin: 4.5 g/dL (ref 3.8–4.9)
Alkaline Phosphatase: 80 IU/L (ref 39–117)
BUN/Creatinine Ratio: 15 (ref 12–28)
BUN: 13 mg/dL (ref 8–27)
Bilirubin Total: 0.4 mg/dL (ref 0.0–1.2)
CO2: 21 mmol/L (ref 20–29)
Calcium: 9.1 mg/dL (ref 8.7–10.3)
Chloride: 101 mmol/L (ref 96–106)
Creatinine, Ser: 0.87 mg/dL (ref 0.57–1.00)
GFR calc Af Amer: 84 mL/min/{1.73_m2} (ref >59)
GFR calc non Af Amer: 73 mL/min/{1.73_m2} (ref >59)
Globulin, Total: 2.6 g/dL (ref 1.5–4.5)
Glucose: 94 mg/dL (ref 65–99)
Potassium: 4.1 mmol/L (ref 3.5–5.2)
Sodium: 138 mmol/L (ref 134–144)
Total Protein: 7.1 g/dL (ref 6.0–8.5)

## 2018-11-25 LAB — LIPID PANEL
Chol/HDL Ratio: 3.3 ratio (ref 0.0–4.4)
Cholesterol, Total: 206 mg/dL — ABNORMAL HIGH (ref 100–199)
HDL: 63 mg/dL (ref >39)
LDL Chol Calc (NIH): 122 mg/dL — ABNORMAL HIGH (ref 0–99)
Triglycerides: 120 mg/dL (ref 0–149)
VLDL Cholesterol Cal: 21 mg/dL (ref 5–40)

## 2018-11-26 NOTE — Progress Notes (Signed)
Pls let pt know labs normal. Can give her results. Lipids are a smidge high--no concern. Rechk in 1 yr.

## 2018-11-27 NOTE — Progress Notes (Signed)
Called pt, no answer, [facility]. 

## 2018-12-10 ENCOUNTER — Telehealth: Payer: Self-pay | Admitting: Obstetrics and Gynecology

## 2018-12-10 NOTE — Telephone Encounter (Signed)
Patient is calling for labs results. Please advise. 

## 2018-12-10 NOTE — Telephone Encounter (Signed)
Called pt, no answer, taken directly to voice mail, [facility].

## 2018-12-10 NOTE — Telephone Encounter (Signed)
You left her a msg a couple wks ago. Thx.

## 2018-12-11 NOTE — Telephone Encounter (Signed)
Called pt, again straight to voice mail.

## 2018-12-16 NOTE — Telephone Encounter (Signed)
Pt aware of results 

## 2019-09-09 ENCOUNTER — Telehealth: Payer: Self-pay

## 2019-09-09 ENCOUNTER — Other Ambulatory Visit: Payer: Self-pay | Admitting: Obstetrics and Gynecology

## 2019-09-09 ENCOUNTER — Other Ambulatory Visit: Payer: Self-pay | Admitting: Family Medicine

## 2019-09-09 DIAGNOSIS — Z1231 Encounter for screening mammogram for malignant neoplasm of breast: Secondary | ICD-10-CM

## 2019-09-09 NOTE — Telephone Encounter (Signed)
Pt calling; has schedule annual for 11/2; needs order for mammogram at [facility] so she can schedule mammogram the same day.  [phone]

## 2019-09-09 NOTE — Telephone Encounter (Signed)
Pt aware.

## 2019-09-09 NOTE — Telephone Encounter (Signed)
Order placed, pls notify pt she can sched.

## 2019-10-16 DIAGNOSIS — Z23 Encounter for immunization: Secondary | ICD-10-CM | POA: Diagnosis not present

## 2019-11-02 ENCOUNTER — Ambulatory Visit: Payer: BC Managed Care – PPO | Admitting: Obstetrics and Gynecology

## 2019-11-09 NOTE — Progress Notes (Signed)
PCP: Maryland Pink, MD   Chief Complaint  Patient presents with  . Gynecologic Exam    HPI:      Rachael Carr is a 59 y.o. [phone] who LMP was No LMP recorded. Patient is postmenopausal., presents today for her annual examination.  Her menses are absent due to menopause. She does not have intermenstrual bleeding. She is s/p D&C/polypectomy/hysteroscopy 9/17 for PMB that resolved after procedure.  Sex activity: rarely with single partner, contraception - post menopausal status. She does have vaginal dryness. She has tried lubricants without relief. Used vag ERT in the past with sx relief but estrace was not covered last yr and too expensive. Pt would like to try vag ERT again.  Last Pap: 11/15/16 Results were: no abnormalities /neg HPV DNA.  Hx of STDs: CIN 1 in 2009  Last mammogram: 11/24/18  Results were: normal repeat in 12 months; with benign LT breast lipoma. Pt has appt today. Hx of LT breast [place] on bx 12/18.  There is no FH of breast cancer. There is no FH of ovarian cancer. Father had pancreatic cancer, pt declined cancer genetic testing last yr. The patient does do self-breast exams.  Colonoscopy: colonoscopy 2/19 without abnormalities with Dr. Gustavo Lah. Repeat due after 10 years.   Tobacco use: The patient denies current or previous tobacco use. Alcohol use: social drinker  No drug use.  Exercise: moderately active  She does get adequate calcium and Vitamin D in her diet.  Borderline lipids 11/20.    Past Medical History:  Diagnosis Date  . Abnormal mammogram 11/2016   PASH LT breast  . Allergy   . Depression   . Dysplasia of cervix, low grade (CIN 1) 2009  . Insomnia     Past Surgical History:  Procedure Laterality Date  . BREAST BIOPSY Left 12/10/2016   [place] bx FIBROCYSTIC CHANGE, PASH  . CHOLECYSTECTOMY  1989  . COLONOSCOPY  2013   polyp; repeat in 5 yrs.  . COLONOSCOPY  2019  . DILATATION & CURETTAGE/HYSTEROSCOPY WITH MYOSURE N/A 09/27/2015    Procedure: DILATATION & CURETTAGE/HYSTEROSCOPY WITH MYOSURE;  Surgeon: Will Bonnet, MD;  Location: [facility] ORS;  Service: Gynecology;  Laterality: N/A;  . FOOT SURGERY Left 2006  . TUBAL LIGATION    . VAGINA SURGERY  1989    Family History  Problem Relation Age of Onset  . Congestive Heart Failure Mother   . Diabetes Mother        DM Type 2  . Pancreatic cancer Father 74  . Hypertension Father   . Stomach cancer Maternal Grandmother   . Bone cancer Maternal Uncle 63  . Throat cancer Maternal Uncle 65  . Breast cancer Neg Hx     Social History   Socioeconomic History  . Marital status: Married    Spouse name: Not on file  . Number of children: Not on file  . Years of education: Not on file  . Highest education level: Not on file  Occupational History  . Not on file  Tobacco Use  . Smoking status: Former Smoker    Years: 8.00    Types: Cigarettes  . Smokeless tobacco: Never Used  . Tobacco comment: QUIT IN 1980'S  Vaping Use  . Vaping Use: Never used  Substance and Sexual Activity  . Alcohol use: Yes    Alcohol/week: 0.0 standard drinks    Comment: OCCASIONALLY  . Drug use: No  . Sexual activity: Yes    Birth control/protection:  Post-menopausal  Other Topics Concern  . Not on file  Social History Narrative  . Not on file   Social Determinants of Health   Financial Resource Strain:   . Difficulty of Paying Living Expenses: Not on file  Food Insecurity:   . Worried About Charity fundraiser in the Last Year: Not on file  . Ran Out of Food in the Last Year: Not on file  Transportation Needs:   . Lack of Transportation (Medical): Not on file  . Lack of Transportation (Non-Medical): Not on file  Physical Activity:   . Days of Exercise per Week: Not on file  . Minutes of Exercise per Session: Not on file  Stress:   . Feeling of Stress : Not on file  Social Connections:   . Frequency of Communication with Friends and Family: Not on file  . Frequency of  Social Gatherings with Friends and Family: Not on file  . Attends Religious Services: Not on file  . Active Member of Clubs or Organizations: Not on file  . Attends Archivist Meetings: Not on file  . Marital Status: Not on file  Intimate Partner Violence:   . Fear of Current or Ex-Partner: Not on file  . Emotionally Abused: Not on file  . Physically Abused: Not on file  . Sexually Abused: Not on file    Current Meds  Medication Sig  . Ascorbic Acid (VITAMIN C PO) Take by mouth.  . cetirizine (ZYRTEC) 10 MG tablet Take 10 mg by mouth daily.  . Fish Oil-Cholecalciferol (FISH OIL + D3 PO) Take by mouth.  . fluticasone (FLONASE) 50 MCG/ACT nasal spray Place into both nostrils daily.  Marland Kitchen ibuprofen (ADVIL,MOTRIN) 600 MG tablet Take 1 tablet (600 mg total) by mouth every 6 (six) hours as needed for mild pain or cramping.  . Multiple Minerals-Vitamins (CITRACAL PLUS PO) Take 1 tablet by mouth daily.  Marland Kitchen VITAMIN D PO Take by mouth.      ROS:  Review of Systems  Constitutional: Negative for fatigue, fever and unexpected weight change.  Respiratory: Negative for cough, shortness of breath and wheezing.   Cardiovascular: Negative for chest pain, palpitations and leg swelling.  Gastrointestinal: Negative for blood in stool, constipation, diarrhea, nausea and vomiting.  Endocrine: Negative for cold intolerance, heat intolerance and polyuria.  Genitourinary: Positive for dyspareunia. Negative for dysuria, flank pain, frequency, genital sores, hematuria, menstrual problem, pelvic pain, urgency, vaginal bleeding, vaginal discharge and vaginal pain.  Musculoskeletal: Negative for back pain, joint swelling and myalgias.  Skin: Negative for rash.  Neurological: Negative for dizziness, syncope, light-headedness, numbness and headaches.  Hematological: Negative for adenopathy.  Psychiatric/Behavioral: Negative for agitation, confusion, sleep disturbance and suicidal ideas. The patient is not  nervous/anxious.      Objective: BP (!) 140/94   Ht 5\' 6"  (1.676 m)   Wt 166 lb (75.3 kg)   BMI 26.79 kg/m    Physical Exam Constitutional:      Appearance: She is well-developed.  Genitourinary:     Vulva, vagina, cervix, uterus, right adnexa and left adnexa normal.     No vulval lesion or tenderness noted.     No vaginal discharge, erythema or tenderness.     No cervical polyp.     Uterus is not enlarged or tender.     No right or left adnexal mass present.     Right adnexa not tender.     Left adnexa not tender.  Neck:  Thyroid: No thyromegaly.  Cardiovascular:     Rate and Rhythm: Normal rate and regular rhythm.     Heart sounds: Normal heart sounds. No murmur heard.   Pulmonary:     Effort: Pulmonary effort is normal.     Breath sounds: Normal breath sounds.  Chest:     Breasts:        Right: No mass, nipple discharge, skin change or tenderness.        Left: No mass, nipple discharge, skin change or tenderness.  Abdominal:     Palpations: Abdomen is soft.     Tenderness: There is no abdominal tenderness. There is no guarding.  Musculoskeletal:        General: Normal range of motion.     Cervical back: Normal range of motion.  Neurological:     General: No focal deficit present.     Mental Status: She is alert and oriented to person, place, and time.     Cranial Nerves: No cranial nerve deficit.  Skin:    General: Skin is warm and dry.  Psychiatric:        Mood and Affect: Mood normal.        Behavior: Behavior normal.        Thought Content: Thought content normal.        Judgment: Judgment normal.  Vitals reviewed.     Assessment/Plan:  Encounter for annual routine gynecological examination  Encounter for screening mammogram for malignant neoplasm of breast  Postmenopausal atrophic vaginitis-- Rx premarin vag crm eRxd, coupon card given. F/u prn.  Dyspareunia--try vag ERT.  Family history of pancreatic cancer--MyRisk testing discussed and  declined.  Blood tests for routine general physical examination - Plan: Comprehensive metabolic panel, Lipid panel  Screening cholesterol level - Plan: Lipid panel     Meds ordered this encounter  Medications  . conjugated estrogens (PREMARIN) vaginal cream    Sig: Insert 1 g vaginally once weekly as maintenance    Dispense:  30 g    Refill:  1    Order Specific Question:   Supervising Provider    Answer:   Gae Dry [[id number]]           GYN counsel mammography screening, menopause, adequate intake of calcium and vitamin D, diet and exercise    F/U  Return in about 1 year (around 11/09/2020).  Dekisha Mesmer B. Raizel Wesolowski, PA-C 11/10/2019 8:33 AM

## 2019-11-10 ENCOUNTER — Ambulatory Visit (INDEPENDENT_AMBULATORY_CARE_PROVIDER_SITE_OTHER): Payer: BC Managed Care – PPO | Admitting: Obstetrics and Gynecology

## 2019-11-10 ENCOUNTER — Other Ambulatory Visit: Payer: Self-pay

## 2019-11-10 ENCOUNTER — Encounter: Payer: Self-pay | Admitting: Obstetrics and Gynecology

## 2019-11-10 ENCOUNTER — Ambulatory Visit
Admission: RE | Admit: 2019-11-10 | Discharge: 2019-11-10 | Disposition: A | Discharge status: Home / Self Care | Payer: BC Managed Care – PPO | Source: Ambulatory Visit | Attending: Obstetrics and Gynecology | Admitting: Obstetrics and Gynecology

## 2019-11-10 VITALS — BP 140/94 | Ht 66.0 in | Wt 166.0 lb

## 2019-11-10 DIAGNOSIS — Z1231 Encounter for screening mammogram for malignant neoplasm of breast: Secondary | ICD-10-CM

## 2019-11-10 DIAGNOSIS — Z01419 Encounter for gynecological examination (general) (routine) without abnormal findings: Secondary | ICD-10-CM

## 2019-11-10 DIAGNOSIS — Z13220 Encounter for screening for lipoid disorders: Secondary | ICD-10-CM | POA: Diagnosis not present

## 2019-11-10 DIAGNOSIS — N941 Unspecified dyspareunia: Secondary | ICD-10-CM

## 2019-11-10 DIAGNOSIS — Z800 Family history of malignant neoplasm of digestive organs: Secondary | ICD-10-CM | POA: Diagnosis not present

## 2019-11-10 DIAGNOSIS — Z0000 Encounter for general adult medical examination without abnormal findings: Secondary | ICD-10-CM

## 2019-11-10 DIAGNOSIS — N952 Postmenopausal atrophic vaginitis: Secondary | ICD-10-CM

## 2019-11-10 DIAGNOSIS — H524 Presbyopia: Secondary | ICD-10-CM | POA: Diagnosis not present

## 2019-11-10 MED ORDER — PREMARIN 0.625 MG/GM VA CREA: TOPICAL_CREAM | VAGINAL | 1 refills | Status: DC

## 2019-11-10 NOTE — Patient Instructions (Signed)
I value your feedback and entrusting us with your care. If you get a [facility] patient survey, I would appreciate you taking the time to let us know about your experience today. Thank you!  As of December 18, 2018, your lab results will be released to your MyChart immediately, before I even have a chance to see them. Please give me time to review them and contact you if there are any abnormalities. Thank you for your patience.  

## 2019-11-11 ENCOUNTER — Telehealth: Payer: Self-pay | Admitting: Obstetrics and Gynecology

## 2019-11-11 DIAGNOSIS — R7989 Other specified abnormal findings of blood chemistry: Secondary | ICD-10-CM

## 2019-11-11 DIAGNOSIS — Z131 Encounter for screening for diabetes mellitus: Secondary | ICD-10-CM

## 2019-11-11 LAB — COMPREHENSIVE METABOLIC PANEL
ALT: 106 IU/L — ABNORMAL HIGH (ref 0–32)
AST: 67 IU/L — ABNORMAL HIGH (ref 0–40)
Albumin/Globulin Ratio: 1.7 (ref 1.2–2.2)
Albumin: 4.3 g/dL (ref 3.8–4.8)
Alkaline Phosphatase: 92 IU/L (ref 44–121)
BUN/Creatinine Ratio: 16 (ref 12–28)
BUN: 12 mg/dL (ref 8–27)
Bilirubin Total: 0.6 mg/dL (ref 0.0–1.2)
CO2: 22 mmol/L (ref 20–29)
Calcium: 9.2 mg/dL (ref 8.7–10.3)
Chloride: 103 mmol/L (ref 96–106)
Creatinine, Ser: 0.73 mg/dL (ref 0.57–1.00)
GFR calc Af Amer: 103 mL/min/{1.73_m2} (ref >59)
GFR calc non Af Amer: 89 mL/min/{1.73_m2} (ref >59)
Globulin, Total: 2.6 g/dL (ref 1.5–4.5)
Glucose: 104 mg/dL — ABNORMAL HIGH (ref 65–99)
Potassium: 4.4 mmol/L (ref 3.5–5.2)
Sodium: 139 mmol/L (ref 134–144)
Total Protein: 6.9 g/dL (ref 6.0–8.5)

## 2019-11-11 LAB — LIPID PANEL
Chol/HDL Ratio: 3.2 ratio (ref 0.0–4.4)
Cholesterol, Total: 239 mg/dL — ABNORMAL HIGH (ref 100–199)
HDL: 74 mg/dL (ref >39)
LDL Chol Calc (NIH): 152 mg/dL — ABNORMAL HIGH (ref 0–99)
Triglycerides: 76 mg/dL (ref 0–149)
VLDL Cholesterol Cal: 13 mg/dL (ref 5–40)

## 2019-11-11 NOTE — Addendum Note (Signed)
Addended by: Ardeth Perfect B on: 16/03/8451 02:53 PM   Modules accepted: Orders

## 2019-11-11 NOTE — Telephone Encounter (Signed)
Pt aware of elevated LFTs and borderline lipids. Pt drinks minimal alcohol and doesn't take tylenol, ASA, NSAIDs regularly. Will check more labs for LFTs. Diet/exercise for lipids. Will f/u with results.

## 2019-11-13 ENCOUNTER — Encounter: Payer: Self-pay | Admitting: Obstetrics and Gynecology

## 2019-11-19 ENCOUNTER — Other Ambulatory Visit: Payer: Self-pay

## 2019-11-19 ENCOUNTER — Other Ambulatory Visit: Payer: BC Managed Care – PPO

## 2019-11-19 DIAGNOSIS — R7989 Other specified abnormal findings of blood chemistry: Secondary | ICD-10-CM

## 2019-11-19 DIAGNOSIS — Z131 Encounter for screening for diabetes mellitus: Secondary | ICD-10-CM | POA: Diagnosis not present

## 2019-11-20 LAB — HEMOGLOBIN A1C
Est. average glucose Bld gHb Est-mCnc: 111 mg/dL
Hgb A1c MFr Bld: 5.5 % (ref 4.8–5.6)

## 2019-11-20 LAB — COMPREHENSIVE METABOLIC PANEL
ALT: 142 IU/L — ABNORMAL HIGH (ref 0–32)
AST: 66 IU/L — ABNORMAL HIGH (ref 0–40)
Albumin/Globulin Ratio: 1.9 (ref 1.2–2.2)
Albumin: 4.7 g/dL (ref 3.8–4.8)
Alkaline Phosphatase: 89 IU/L (ref 44–121)
BUN/Creatinine Ratio: 18 (ref 12–28)
BUN: 14 mg/dL (ref 8–27)
Bilirubin Total: 0.5 mg/dL (ref 0.0–1.2)
CO2: 24 mmol/L (ref 20–29)
Calcium: 9.8 mg/dL (ref 8.7–10.3)
Chloride: 102 mmol/L (ref 96–106)
Creatinine, Ser: 0.79 mg/dL (ref 0.57–1.00)
GFR calc Af Amer: 93 mL/min/{1.73_m2} (ref >59)
GFR calc non Af Amer: 81 mL/min/{1.73_m2} (ref >59)
Globulin, Total: 2.5 g/dL (ref 1.5–4.5)
Glucose: 86 mg/dL (ref 65–99)
Potassium: 5.1 mmol/L (ref 3.5–5.2)
Sodium: 142 mmol/L (ref 134–144)
Total Protein: 7.2 g/dL (ref 6.0–8.5)

## 2019-11-20 LAB — IRON,TIBC AND FERRITIN PANEL
Ferritin: 400 ng/mL — ABNORMAL HIGH (ref 15–150)
Iron Saturation: 26 % (ref 15–55)
Iron: 91 ug/dL (ref 27–139)
Total Iron Binding Capacity: 344 ug/dL (ref 250–450)
UIBC: 253 ug/dL (ref 118–369)

## 2019-11-20 LAB — HEPATITIS C ANTIBODY: Hep C Virus Ab: <0.1 s/co ratio (ref 0.0–0.9)

## 2019-11-20 LAB — HEPATITIS B SURFACE ANTIGEN: Hepatitis B Surface Ag: NEGATIVE

## 2019-11-20 LAB — HEPATITIS B SURFACE ANTIBODY,QUALITATIVE: Hep B Surface Ab, Qual: NONREACTIVE

## 2019-11-20 LAB — HEPATITIS B CORE ANTIBODY, IGM: Hep B C IgM: NEGATIVE

## 2019-11-23 NOTE — Progress Notes (Signed)
Refer to GI for elevated LFTs/liver disease. Pt takes minimal tylenol/NSAIDs/alcohol. Has FH elevated LFTs in 2 sisters/nephew. No FH hemochromatosis.

## 2020-01-14 ENCOUNTER — Encounter: Payer: Self-pay | Admitting: Gastroenterology

## 2020-01-14 ENCOUNTER — Ambulatory Visit (INDEPENDENT_AMBULATORY_CARE_PROVIDER_SITE_OTHER): Payer: BC Managed Care – PPO | Admitting: Gastroenterology

## 2020-01-14 VITALS — BP 179/111 | HR 105 | Temp 98.1°F | Ht 66.0 in | Wt 160.4 lb

## 2020-01-14 DIAGNOSIS — R7989 Other specified abnormal findings of blood chemistry: Secondary | ICD-10-CM

## 2020-01-14 DIAGNOSIS — R945 Abnormal results of liver function studies: Secondary | ICD-10-CM

## 2020-01-14 NOTE — Patient Instructions (Addendum)
Mediterranean Diet A Mediterranean diet refers to food and lifestyle choices that are based on the traditions of countries located on the [employer]. This way of eating has been shown to help prevent certain conditions and improve outcomes for people who have chronic diseases, like kidney disease and heart disease. What are tips for following this plan? Lifestyle  Cook and eat meals together with your family, when possible.  Drink enough fluid to keep your urine clear or pale yellow.  Be physically active every day. This includes: ? Aerobic exercise like running or swimming. ? Leisure activities like gardening, walking, or housework.  Get 7-8 hours of sleep each night.  If recommended by your health care provider, drink red wine in moderation. This means 1 glass a day for nonpregnant women and 2 glasses a day for men. A glass of wine equals 5 oz (150 mL). Reading food labels   Check the serving size of packaged foods. For foods such as rice and pasta, the serving size refers to the amount of cooked product, not dry.  Check the total fat in packaged foods. Avoid foods that have saturated fat or trans fats.  Check the ingredients list for added sugars, such as corn syrup. Shopping  At the grocery store, buy most of your food from the areas near the walls of the store. This includes: ? Fresh fruits and vegetables (produce). ? Grains, beans, nuts, and seeds. Some of these may be available in unpackaged forms or large amounts (in bulk). ? Fresh seafood. ? Poultry and eggs. ? Low-fat dairy products.  Buy whole ingredients instead of prepackaged foods.  Buy fresh fruits and vegetables in-season from local farmers markets.  Buy frozen fruits and vegetables in resealable bags.  If you do not have access to quality fresh seafood, buy precooked frozen shrimp or canned fish, such as tuna, salmon, or sardines.  Buy small amounts of raw or cooked vegetables, salads, or  olives from the deli or salad bar at your store.  Stock your pantry so you always have certain foods on hand, such as olive oil, canned tuna, canned tomatoes, rice, pasta, and beans. Cooking  Cook foods with extra-virgin olive oil instead of using butter or other vegetable oils.  Have meat as a side dish, and have vegetables or grains as your main dish. This means having meat in small portions or adding small amounts of meat to foods like pasta or stew.  Use beans or vegetables instead of meat in common dishes like chili or lasagna.  Experiment with different cooking methods. Try roasting or broiling vegetables instead of steaming or sauteing them.  Add frozen vegetables to soups, stews, pasta, or rice.  Add nuts or seeds for added healthy fat at each meal. You can add these to yogurt, salads, or vegetable dishes.  Marinate fish or vegetables using olive oil, lemon juice, garlic, and fresh herbs. Meal planning   Plan to eat 1 vegetarian meal one day each week. Try to work up to 2 vegetarian meals, if possible.  Eat seafood 2 or more times a week.  Have healthy snacks readily available, such as: ? Vegetable sticks with hummus. ? [place] yogurt. ? Fruit and nut trail mix.  Eat balanced meals throughout the week. This includes: ? Fruit: 2-3 servings a day ? Vegetables: 4-5 servings a day ? Low-fat dairy: 2 servings a day ? Fish, poultry, or lean meat: 1 serving a day ? Beans and legumes: 2 or more  servings a week ? Nuts and seeds: 1-2 servings a day ? Whole grains: 6-8 servings a day ? Extra-virgin olive oil: 3-4 servings a day  Limit red meat and sweets to only a few servings a month What are my food choices?  Mediterranean diet ? Recommended  Grains: Whole-grain pasta. Brown rice. Bulgar wheat. Polenta. Couscous. Whole-wheat bread. Orpah Cobb.  Vegetables: Artichokes. Beets. Broccoli. Cabbage. Carrots. Eggplant. Green beans. Chard. Kale. Spinach. Onions. Leeks.  Peas. Squash. Tomatoes. Peppers. Radishes.  Fruits: Apples. Apricots. Avocado. Berries. Bananas. Cherries. Dates. Figs. Grapes. Lemons. Melon. Oranges. Peaches. Plums. Pomegranate.  Meats and other protein foods: Beans. Almonds. Sunflower seeds. Pine nuts. Peanuts. Cod. Salmon. Scallops. Shrimp. Tuna. Tilapia. Clams. Oysters. Eggs.  Dairy: Low-fat milk. Cheese. Greek yogurt.  Beverages: Water. Red wine. Herbal tea.  Fats and oils: Extra virgin olive oil. Avocado oil. Grape seed oil.  Sweets and desserts: [place] yogurt with honey. Baked apples. Poached pears. Trail mix.  Seasoning and other foods: Basil. Cilantro. Coriander. Cumin. Mint. Parsley. Sage. Rosemary. Tarragon. Garlic. Oregano. Thyme. Pepper. Balsalmic vinegar. Tahini. Hummus. Tomato sauce. Olives. Mushrooms. ? Limit these  Grains: Prepackaged pasta or rice dishes. Prepackaged cereal with added sugar.  Vegetables: Deep fried potatoes (french fries).  Fruits: Fruit canned in syrup.  Meats and other protein foods: Beef. Pork. Lamb. Poultry with skin. Hot dogs. Tomasa Blase.  Dairy: Ice cream. Sour cream. Whole milk.  Beverages: Juice. Sugar-sweetened soft drinks. Beer. Liquor and spirits.  Fats and oils: Butter. Canola oil. Vegetable oil. Beef fat (tallow). Lard.  Sweets and desserts: Cookies. Cakes. Pies. Candy.  Seasoning and other foods: Mayonnaise. Premade sauces and marinades. The items listed may not be a complete list. Talk with your dietitian about what dietary choices are right for you. Summary  The Mediterranean diet includes both food and lifestyle choices.  Eat a variety of fresh fruits and vegetables, beans, nuts, seeds, and whole grains.  Limit the amount of red meat and sweets that you eat.  Talk with your health care provider about whether it is safe for you to drink red wine in moderation. This means 1 glass a day for nonpregnant women and 2 glasses a day for men. A glass of wine equals 5 oz (150 mL). This  information is not intended to replace advice given to you by your health care provider. Make sure you discuss any questions you have with your health care provider. Document Revised: 08/25/2015 Document Reviewed: 08/18/2015 Elsevier Patient Education  2020 Elsevier Inc. Nonalcoholic Fatty Liver Disease Diet, Adult Nonalcoholic fatty liver disease is a condition that causes fat to build up in and around the liver. The disease makes it harder for the liver to work the way that it should. Following a healthy diet can help to keep nonalcoholic fatty liver disease under control. It can also help to prevent or improve conditions that are associated with the disease, such as heart disease, diabetes, high blood pressure, and abnormal cholesterol levels. Along with regular exercise, this diet:  Promotes weight loss.  Helps to control blood sugar levels.  Helps to improve the way that the body uses insulin. What are tips for following this plan? Reading food labels Always check food labels for:  The amount of saturated fat in a food. You should limit your intake of saturated fat. Saturated fat is found in foods that come from animals, including meat and dairy products such as butter, cheese, and whole milk.  The amount of fiber in a food. You  should choose high-fiber foods such as fruits, vegetables, and whole grains. Try to get 25-30 grams (g) of fiber a day.  Cooking  When cooking, use heart-healthy oils that are high in monounsaturated fats. These include olive oil, canola oil, and avocado oil.  Limit frying or deep-frying foods. Cook foods using healthy methods such as baking, boiling, steaming, and grilling instead. Meal planning  You may want to keep track of how many calories you take in. Eating the right amount of calories will help you achieve a healthy weight. Meeting with a registered dietitian can help you get started.  Limit how often you eat takeout and fast food. These foods are  usually very high in fat, salt, and sugar.  Use the glycemic index (GI) to plan your meals. The index tells you how quickly a food will raise your blood sugar. Choose low-GI foods (GI less than 55). These foods take a longer time to raise blood sugar. A registered dietitian can help you identify foods lower on the GI scale. Lifestyle  You may want to follow a Mediterranean diet. This diet includes a lot of vegetables, lean meats or fish, whole grains, fruits, and healthy oils and fats. What foods can I eat?  Fruits Bananas. Apples. Oranges. Grapes. Papaya. Mango. Pomegranate. Kiwi. Grapefruit. Cherries. Vegetables Lettuce. Spinach. Peas. Beets. Cauliflower. Cabbage. Broccoli. Carrots. Tomatoes. Squash. Eggplant. Herbs. Peppers. Onions. Cucumbers. Brussels sprouts. Yams and sweet potatoes. Beans. Lentils. Grains Whole wheat or whole-grain foods, including breads, crackers, cereals, and pasta. Stone-ground whole wheat. Unsweetened oatmeal. Bulgur. Barley. Quinoa. Brown or wild rice. Corn or whole wheat flour tortillas. Meats and other proteins Lean meats. Poultry. Tofu. Seafood and shellfish. Dairy Low-fat or fat-free dairy products, such as yogurt, cottage cheese, or cheese. Beverages Water. Sugar-free drinks. Tea. Coffee. Low-fat or skim milk. Milk alternatives, such as soy or almond milk. Real fruit juice. Fats and oils Avocado. Canola or olive oil. Nuts and nut butters. Seeds. Seasonings and condiments Mustard. Relish. Low-fat, low-sugar ketchup and barbecue sauce. Low-fat or fat-free mayonnaise. Sweets and desserts Sugar-free sweets. The items listed above may not be a complete list of foods and beverages you can eat. Contact a dietitian for more information. What foods should I limit or avoid? Meats and other proteins Limit red meat to 1-2 times a week. Dairy [employer]. Fats and oils Palm oil and coconut oil. Fried foods. Other foods Processed foods. Foods that contain a  lot of salt or sodium. Sweets and desserts Sweets that contain sugar. Beverages Sweetened drinks, such as sweet tea, milkshakes, iced sweet drinks, and sodas. Alcohol. The items listed above may not be a complete list of foods and beverages you should avoid. Contact a dietitian for more information. Where to find more information [employer] of Diabetes and Digestive and Kidney Diseases: [employer] Summary  Nonalcoholic fatty liver disease is a condition that causes fat to build up in and around the liver.  Following a healthy diet can help to keep nonalcoholic fatty liver disease under control. Your diet should be rich in fruits, vegetables, whole grains, and lean proteins.  Limit your intake of saturated fat. Saturated fat is found in foods that come from animals, including meat and dairy products such as butter, cheese, and whole milk.  This diet promotes weight loss, helps to control blood sugar levels, and helps to improve the way that the body uses insulin. This information is not intended to replace advice given to you by your health care provider.  Make sure you discuss any questions you have with your health care provider. Document Revised: 04/18/2018 Document Reviewed: 01/16/2018 Elsevier Patient Education  [place].

## 2020-01-14 NOTE — Progress Notes (Signed)
 Kiran Anna MD, MRCP(U.K) [address]  [address]  [place], [place] [address]  Main: [phone]  Fax: [phone]   Gastroenterology Consultation  Referring Provider:     Copland, Alicia B, PA-C Primary Care Physician:  Hedrick, James, MD Primary Gastroenterologist:  Dr. Kiran Anna  Reason for Consultation:     Abnormal LFT's        HPI:   Rachael Carr is a 60 y.o. y/o female referred for abnormal LFT's.    First noted abnormality in LFT's in November 2020.  Alcohol use occasional*  Drug use never Over the counter herbal supplements none New medications none Abdominal pain none Tattoos yes over her back many years back Military service none Prior blood transfusion none Incarceration none Family history of liver disease none Recent change in weight gain some during Covid   11/19/2019, ferritin 400 but iron percentage saturation normal, hepatitis B surface antibody nonreactive, hepatitis B surface antigen negative, hepatitis C virus antibody negative,  Hepatic Function Latest Ref Rng & Units 11/19/2019 11/10/2019 11/24/2018  Total Protein 6.0 - 8.5 g/dL 7.2 6.9 7.1  Albumin 3.8 - 4.8 g/dL 4.7 4.3 4.5  AST 0 - 40 IU/L 66(H) 67(H) 26  ALT 0 - 32 IU/L 142(H) 106(H) 36(H)  Alk Phosphatase 44 - 121 IU/L 89 92 80  Total Bilirubin 0.0 - 1.2 mg/dL 0.5 0.6 0.4    Past Medical History:  Diagnosis Date  . Abnormal mammogram 11/2016   PASH LT breast  . Allergy   . Depression   . Dysplasia of cervix, low grade (CIN 1) 2009  . Elevated LFTs   . Insomnia     Past Surgical History:  Procedure Laterality Date  . BREAST BIOPSY Left 12/10/2016   US bx FIBROCYSTIC CHANGE, PASH  . CHOLECYSTECTOMY  1989  . COLONOSCOPY  2013   polyp; repeat in 5 yrs.  . COLONOSCOPY  2019  . DILATATION & CURETTAGE/HYSTEROSCOPY WITH MYOSURE N/A 09/27/2015   Procedure: DILATATION & CURETTAGE/HYSTEROSCOPY WITH MYOSURE;  Surgeon: Stephen D Jackson, MD;  Location: [facility] ORS;  Service:  Gynecology;  Laterality: N/A;  . FOOT SURGERY Left 2006  . TUBAL LIGATION    . VAGINA SURGERY  1989    Prior to Admission medications   Medication Sig Start Date End Date Taking? Authorizing Provider  Cholecalciferol (VITAMIN D3) 1.25 MG (50000 UT) CAPS Take 1 capsule by mouth daily.   Yes [provider]  Cinnamon 500 MG capsule Take 1,000 mg by mouth 2 (two) times daily.   Yes [provider]  Probiotic Product (PROBIOTIC-10 PO) Take 1 tablet by mouth daily.   Yes [provider]  Turmeric 500 MG TABS Take 1 tablet by mouth daily.   Yes [provider]  albuterol (VENTOLIN HFA) 108 (90 Base) MCG/ACT inhaler Inhale into the lungs. 12/27/17 12/27/18  [provider]  Ascorbic Acid (VITAMIN C PO) Take by mouth.    [provider]  cetirizine (ZYRTEC) 10 MG tablet Take 10 mg by mouth daily.    [provider]  fluticasone (FLONASE) 50 MCG/ACT nasal spray Place into both nostrils daily.    [provider]  ibuprofen (ADVIL,MOTRIN) 600 MG tablet Take 1 tablet (600 mg total) by mouth every 6 (six) hours as needed for mild pain or cramping. 09/27/15   Jackson, Stephen D, MD  Multiple Minerals-Vitamins (CITRACAL PLUS PO) Take 1 tablet by mouth daily.    [provider]    Family History    Problem Relation Age of Onset  . Congestive Heart Failure Mother   . Diabetes Mother        DM Type 2  . Pancreatic cancer Father 28  . Hypertension Father   . Other Sister        elevated LFTs  . Other Sister        elevated LFTs  . Stomach cancer Maternal Grandmother   . Bone cancer Maternal Uncle 63  . Throat cancer Maternal Uncle 65  . Breast cancer Neg Hx      Social History   Tobacco Use  . Smoking status: Former Smoker    Years: 8.00    Types: Cigarettes  . Smokeless tobacco: Never Used  . Tobacco comment: QUIT IN 1980'S  Vaping Use  . Vaping Use: Never used  Substance Use Topics  . Alcohol use: Yes     Alcohol/week: 0.0 standard drinks    Comment: OCCASIONALLY  . Drug use: No    Allergies as of 01/14/2020 - Review Complete 01/14/2020  Allergen Reaction Noted  . Demerol [meperidine] Other (See Comments) 09/20/2014  . Codeine Palpitations 09/27/2015    Review of Systems:    All systems reviewed and negative except where noted in HPI.   Physical Exam:  There were no vitals taken for this visit. No LMP recorded. Patient is postmenopausal. Psych:  Alert and cooperative. Normal mood and affect. General:   Alert,  Well-developed, well-nourished, pleasant and cooperative in NAD Head:  Normocephalic and atraumatic. Eyes:  Sclera clear, no icterus.   Conjunctiva pink. Ears:  Normal auditory acuity. Nose:  No deformity, discharge, or lesions. Lungs:  Respirations even and unlabored.  Clear throughout to auscultation.   No wheezes, crackles, or rhonchi. No acute distress. Heart:  Regular rate and rhythm; no murmurs, clicks, rubs, or gallops. Abdomen:  Normal bowel sounds.  No bruits.  Soft, non-tender and non-distended without masses, hepatosplenomegaly or hernias noted.  No guarding or rebound tenderness.    Neurologic:  Alert and oriented x3;  grossly normal neurologically. Psych:  Alert and cooperative. Normal mood and affect.  Imaging Studies: No results found.  Assessment and Plan:   Rachael Carr is a 60 y.o. y/o female has been referred for abnormal LFTs..  No obvious risk factors.  We will plan to rule out causes such as autoimmune hepatitis and obtain an ultrasound of her liver.  Based on results we will discuss the neck steps.  In the meanwhile I have given her information about fatty liver disease and information to follow Mediterranean diet.  Follow up in 4 to 6 weeks  Dr Jonathon Bellows MD,MRCP(U.K)

## 2020-01-18 ENCOUNTER — Telehealth: Payer: Self-pay | Admitting: Gastroenterology

## 2020-01-18 LAB — IMMUNOGLOBULINS A/E/G/M, SERUM
IgA/Immunoglobulin A, Serum: 302 mg/dL (ref 87–352)
IgE (Immunoglobulin E), Serum: 179 IU/mL (ref 6–495)
IgG (Immunoglobin G), Serum: 1027 mg/dL (ref 586–1602)
IgM (Immunoglobulin M), Srm: 112 mg/dL (ref 26–217)

## 2020-01-18 LAB — MITOCHONDRIAL/SMOOTH MUSCLE AB PNL
Mitochondrial Ab: <20 Units (ref 0.0–20.0)
Smooth Muscle Ab: 4 Units (ref 0–19)

## 2020-01-18 LAB — HIV ANTIBODY (ROUTINE TESTING W REFLEX): HIV Screen 4th Generation wRfx: NONREACTIVE

## 2020-01-18 LAB — HEPATITIS B CORE ANTIBODY, TOTAL: Hep B Core Total Ab: NEGATIVE

## 2020-01-18 LAB — CK: Total CK: 99 U/L (ref 32–182)

## 2020-01-18 LAB — ALPHA-1-ANTITRYPSIN: A-1 Antitrypsin: 117 mg/dL (ref 101–187)

## 2020-01-18 LAB — HEPATITIS B E ANTIBODY: Hep B E Ab: NEGATIVE

## 2020-01-18 LAB — IRON,TIBC AND FERRITIN PANEL
Ferritin: 339 ng/mL — ABNORMAL HIGH (ref 15–150)
Iron Saturation: 17 % (ref 15–55)
Iron: 62 ug/dL (ref 27–139)
Total Iron Binding Capacity: 365 ug/dL (ref 250–450)
UIBC: 303 ug/dL (ref 118–369)

## 2020-01-18 LAB — ANA: Anti Nuclear Antibody (ANA): NEGATIVE

## 2020-01-18 LAB — ANTI-MICROSOMAL ANTIBODY LIVER / KIDNEY: LKM1 Ab: 0.7 Units (ref 0.0–20.0)

## 2020-01-18 LAB — HEPATITIS B E ANTIGEN: Hep B E Ag: NEGATIVE

## 2020-01-18 LAB — HEPATITIS A ANTIBODY, TOTAL: hep A Total Ab: POSITIVE — AB

## 2020-01-18 LAB — CELIAC DISEASE AB SCREEN W/RFX
Antigliadin Abs, IgA: 4 units (ref 0–19)
Transglutaminase IgA: <2 U/mL (ref 0–3)

## 2020-01-18 LAB — CERULOPLASMIN: Ceruloplasmin: 26.6 mg/dL (ref 19.0–39.0)

## 2020-01-18 NOTE — Telephone Encounter (Signed)
Patient has been informed of results per Dr. Vicente Males. Forwarded message via my chart as well. Pt verbalized understanding.

## 2020-01-18 NOTE — Telephone Encounter (Signed)
Inform lab results have been reviewed.  The hepatitis A antibody is positive which means that she is immune to hepatitis A which could be related to prior immunization or prior infection with hepatitis A.  It is nothing to worry about rather she is protected from future hepatitis A infections.  Ferritin is elevated which means there is inflammation which can be related to fatty liver disease or inflammation in any other part of the body or from a recent infection such as a sinus infection.  I would not want to evaluate this any further.  She has no evidence of any autoimmune liver disease.  She is not immune to hepatitis B.  Would suggest vaccination for hepatitis B.

## 2020-01-18 NOTE — Telephone Encounter (Signed)
Patient LM on voicemail.  She has questions about her results, please call to advise

## 2020-01-21 ENCOUNTER — Ambulatory Visit
Admission: RE | Admit: 2020-01-21 | Discharge: 2020-01-21 | Disposition: A | Discharge status: Home / Self Care | Payer: BC Managed Care – PPO | Source: Ambulatory Visit | Attending: Gastroenterology | Admitting: Gastroenterology

## 2020-01-21 ENCOUNTER — Other Ambulatory Visit: Payer: Self-pay

## 2020-01-21 ENCOUNTER — Telehealth: Payer: Self-pay

## 2020-01-21 DIAGNOSIS — R7989 Other specified abnormal findings of blood chemistry: Secondary | ICD-10-CM | POA: Diagnosis not present

## 2020-01-21 DIAGNOSIS — K838 Other specified diseases of biliary tract: Secondary | ICD-10-CM

## 2020-01-21 DIAGNOSIS — R945 Abnormal results of liver function studies: Secondary | ICD-10-CM | POA: Insufficient documentation

## 2020-01-21 NOTE — Telephone Encounter (Signed)
Patient has been contact via my chart of scheduled MRCP.

## 2020-01-21 NOTE — Progress Notes (Signed)
Yes

## 2020-01-21 NOTE — Telephone Encounter (Signed)
-----   Message from Jonathon Bellows, MD sent at 01/21/2020 11:58 AM EST ----- Biliary dilation/bile duct dilated/ or abnormal ultrasound ----- Message ----- From: Storm Frisk, CMA Sent: 01/21/2020  11:55 AM EST To: Jonathon Bellows, MD  What is the Dx?  ----- Message ----- From: Jonathon Bellows, MD Sent: 01/21/2020  11:24 AM EST To: Storm Frisk, CMA  Yes

## 2020-01-22 DIAGNOSIS — Z23 Encounter for immunization: Secondary | ICD-10-CM | POA: Diagnosis not present

## 2020-01-25 ENCOUNTER — Ambulatory Visit: Payer: BC Managed Care – PPO | Admitting: Dermatology

## 2020-01-29 ENCOUNTER — Other Ambulatory Visit: Payer: Self-pay | Admitting: Gastroenterology

## 2020-01-29 ENCOUNTER — Other Ambulatory Visit: Payer: Self-pay

## 2020-01-29 ENCOUNTER — Ambulatory Visit
Admission: RE | Admit: 2020-01-29 | Discharge: 2020-01-29 | Disposition: A | Discharge status: Home / Self Care | Payer: BC Managed Care – PPO | Source: Ambulatory Visit | Attending: Gastroenterology | Admitting: Gastroenterology

## 2020-01-29 DIAGNOSIS — K838 Other specified diseases of biliary tract: Secondary | ICD-10-CM | POA: Diagnosis not present

## 2020-01-29 DIAGNOSIS — K760 Fatty (change of) liver, not elsewhere classified: Secondary | ICD-10-CM | POA: Diagnosis not present

## 2020-01-29 DIAGNOSIS — N281 Cyst of kidney, acquired: Secondary | ICD-10-CM | POA: Diagnosis not present

## 2020-01-29 MED ORDER — GADOBUTROL 1 MMOL/ML IV SOLN
7.5000 mL | Freq: Once | INTRAVENOUS | Status: AC | PRN
  Administered 2020-01-29: 7.5 mL via INTRAVENOUS

## 2020-02-02 ENCOUNTER — Telehealth: Payer: Self-pay | Admitting: Gastroenterology

## 2020-02-02 NOTE — Telephone Encounter (Signed)
Patient verbalized understanding of results  

## 2020-02-02 NOTE — Telephone Encounter (Signed)
Patient is calling about her MRI she had done on 01/29/20

## 2020-02-02 NOTE — Telephone Encounter (Signed)
Patient wants results from MRI

## 2020-02-02 NOTE — Telephone Encounter (Signed)
MRI shows no blockage or any findings that are concerning . Fatty liver noted

## 2020-02-17 ENCOUNTER — Ambulatory Visit (INDEPENDENT_AMBULATORY_CARE_PROVIDER_SITE_OTHER): Payer: BC Managed Care – PPO | Admitting: Dermatology

## 2020-02-17 ENCOUNTER — Other Ambulatory Visit: Payer: Self-pay

## 2020-02-17 DIAGNOSIS — D229 Melanocytic nevi, unspecified: Secondary | ICD-10-CM

## 2020-02-17 DIAGNOSIS — Z1283 Encounter for screening for malignant neoplasm of skin: Secondary | ICD-10-CM

## 2020-02-17 DIAGNOSIS — L814 Other melanin hyperpigmentation: Secondary | ICD-10-CM

## 2020-02-17 DIAGNOSIS — L603 Nail dystrophy: Secondary | ICD-10-CM

## 2020-02-17 DIAGNOSIS — L821 Other seborrheic keratosis: Secondary | ICD-10-CM

## 2020-02-17 DIAGNOSIS — L813 Cafe au lait spots: Secondary | ICD-10-CM

## 2020-02-17 DIAGNOSIS — L820 Inflamed seborrheic keratosis: Secondary | ICD-10-CM

## 2020-02-17 DIAGNOSIS — L578 Other skin changes due to chronic exposure to nonionizing radiation: Secondary | ICD-10-CM

## 2020-02-17 DIAGNOSIS — L710 Perioral dermatitis: Secondary | ICD-10-CM

## 2020-02-17 DIAGNOSIS — I8310 Varicose veins of unspecified lower extremity with inflammation: Secondary | ICD-10-CM

## 2020-02-17 MED ORDER — CLINDAMYCIN PHOSPHATE 1 % EX LOTN: TOPICAL_LOTION | CUTANEOUS | 0 refills | Status: DC

## 2020-02-17 MED ORDER — TAVABOROLE 5 % EX SOLN: CUTANEOUS | 5 refills | Status: DC

## 2020-02-17 MED ORDER — DOXYCYCLINE MONOHYDRATE 100 MG PO CAPS: 100.0000 mg | ORAL_CAPSULE | Freq: Every day | ORAL | 1 refills | Status: DC

## 2020-02-17 NOTE — Progress Notes (Signed)
New Patient Visit  Subjective  Rachael Carr is a 60 y.o. female who presents for the following: Skin check (Patient presents today for TBSE. She has a growth on her right cheek that gets rubbed. She also has bumps that come up on her lips off and on. They first started after she got her first Covid vaccine. She can feel a sensation before the bump comes up. She uses Cortisone when they come up, and it helps. She has no history of fever blisters. No new lipstick or gloss. Bumps aren't itchy or painful when they come up. No history of skin cancer.).  She has been using HC cream since November after the bumps came up- helps a little but doesn't clear them up. She also has abnormal toenail.  Not sure how it came up.  She gets pedicures.  The following portions of the chart were reviewed this encounter and updated as appropriate:       Review of Systems:  No other skin or systemic complaints except as noted in HPI or Assessment and Plan.  Objective  Well appearing patient in no apparent distress; mood and affect are within normal limits.  A full examination was performed including scalp, head, eyes, ears, nose, lips, neck, chest, axillae, abdomen, back, buttocks, bilateral upper extremities, bilateral lower extremities, hands, feet, fingers, toes, fingernails, and toenails. All findings within normal limits unless otherwise noted below.  Objective  R lower cheek: Erythematous keratotic or waxy stuck-on papule   Objective  Left great toenail: 75% yellow/brown discoloration of left great toenail with onycholysis.  Images    Objective  Upper lip: Tiny light pink papules.   Assessment & Plan   Skin cancer screening performed today.  Actinic Damage - chronic, secondary to cumulative UV radiation exposure/sun exposure over time - diffuse scaly erythematous macules with underlying dyspigmentation - Recommend daily broad spectrum sunscreen SPF 30+ to sun-exposed areas, reapply every  2 hours as needed.  - Call for new or changing lesions.  Lentigines - Scattered tan macules - Discussed due to sun exposure - Benign, observe - Recommend daily broad spectrum sunscreen SPF 30+ to sun-exposed areas, reapply every 2 hours as needed. - Call for any changes  Seborrheic Keratoses - Stuck-on, waxy, tan-brown papules and plaques  - Discussed benign etiology and prognosis. - Observe - Call for any changes  Melanocytic Nevi - Tan-brown and/or pink-flesh-colored symmetric macules and papules - Benign appearing on exam today - Observation - Call clinic for new or changing moles - Recommend daily use of broad spectrum spf 30+ sunscreen to sun-exposed areas.   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, right hip - Genetic - Benign, observe - Call for any changes  Spider Veins - Dilated blue, purple or red veins at the lower extremities - Reassured - These can be treated by sclerotherapy (a procedure to inject a medicine into the veins to make them disappear) if desired, but the treatment is not covered by insurance   Inflamed seborrheic keratosis R lower cheek  Destruction of lesion - R lower cheek  Destruction method: cryotherapy   Informed consent: discussed and consent obtained   Lesion destroyed using liquid nitrogen: Yes   Region frozen until ice ball extended beyond lesion: Yes   Outcome: patient tolerated procedure well with no complications   Post-procedure details: wound care instructions given    Nail dystrophy Left great toenail  Tinea Unguium (more likely) vs Trauma  Discussed terbinafine, but patient  has increased LFTs and is getting work-up for that.   Start Kerydin solution Apply to affected toenail qhs until improved dsp 51mL 5Rf. ([place])   Tavaborole (KERYDIN) 5 % SOLN - Left great toenail  Perioral dermatitis Upper lip  Start doxycycline 100mg  take 1 po QD with food dsp #30 1Rf. Start clindamycin lotion Apply to AA qam until  improved dsp 81mL 0Rf. D/C HC cream, topical steroids can make this condition worse. Discussed can take 1-2 months for it to clear.  She will call back if not improving.  Doxycycline should be taken with food to prevent nausea. Do not lay down for 30 minutes after taking. Be cautious with sun exposure and use good sun protection while on this medication. Pregnant women should not take this medication.      doxycycline (MONODOX) 100 MG capsule - Upper lip  clindamycin (CLEOCIN-T) 1 % lotion - Upper lip  Return in about 1 year (around 02/16/2021) for TBSE.   IJamesetta Orleans, CMA, am acting as scribe for Brendolyn Patty, MD .  Documentation: I have reviewed the above documentation for accuracy and completeness, and I agree with the above.  Brendolyn Patty MD

## 2020-02-17 NOTE — Patient Instructions (Addendum)
Cryotherapy Aftercare  . Wash gently with soap and water everyday.   Marland Kitchen Apply Vaseline and Band-Aid daily until healed.   Seborrheic Keratosis  What causes seborrheic keratoses? Seborrheic keratoses are harmless, common skin growths that first appear during adult life.  As time goes by, more growths appear.  Some people may develop a large number of them.  Seborrheic keratoses appear on both covered and uncovered body parts.  They are not caused by sunlight.  The tendency to develop seborrheic keratoses can be inherited.  They vary in color from skin-colored to gray, brown, or even black.  They can be either smooth or have a rough, warty surface.   Seborrheic keratoses are superficial and look as if they were stuck on the skin.  Under the microscope this type of keratosis looks like layers upon layers of skin.  That is why at times the top layer may seem to fall off, but the rest of the growth remains and re-grows.    Treatment Seborrheic keratoses do not need to be treated, but can easily be removed in the office.  Seborrheic keratoses often cause symptoms when they rub on clothing or jewelry.  Lesions can be in the way of shaving.  If they become inflamed, they can cause itching, soreness, or burning.  Removal of a seborrheic keratosis can be accomplished by freezing, burning, or surgery. If any spot bleeds, scabs, or grows rapidly, please return to have it checked, as these can be an indication of a skin cancer.     BEFORE YOUR APPOINTMENT FOR SCLEROTHERAPY  1. When you telephone for your appointment for the sclerotherapy procedure, please let the receptionist know that you are scheduling for the fifteen (15) minute sclerotherapy procedure not just a regular visit.  2. On the day of the procedure, please cleanse and dry the areas, but do not use any moisturizers or other products on the area(s) to be treated.  3. Bring a pair of comfortable shorts to wear during the procedure.  4. Be  sure to bring your recommended graduated compression stockings with you to the office. You will be wearing them home when your visit is over. These compression hose can be purchased at most medical supply stores.  After Your Sclerotherapy Procedure  1. Please wear the graduated compression stockings for 24 hours immediately following the completion of the sclerotherapy procedure.  2. We recommend that you avoid vigorous activity as much as possible for the first twenty-four (24) hours. You can do your "normal" routine, but avoid an above normal amount of time on your feet. Elevating the legs when sitting and avoidance of vigorous leg movements or exercise in the first few days after treatment may improve your results.  3. You may remove the compression dressings (cotton balls) and tape the next morning.  4. Please continue wearing the compression stockings during waking hours for the two (2) weeks following sclerotherapy.  5. If you have any blisters, sores or ulcers or other problems following your procedure please call or return to the office immediately.     THE PROCEDURE FEE IS $350.00 PER FIFTEEN (15) MINUTE SESSION. WE REQUIRE THAT THIS PROCEDURE BE PAID FOR IN FULL ON OR BEFORE THE DATE THAT IT IS PERFORMED. WE WILL GIVE YOU A RECEIPT THAT YOU CAN FILE WITH YOUR INSURANCE COMPANY. WE GENERALLY DO NOT FILE THIS PROCEDURE WITH ANY INSURANCE COMPANY EXCEPT UNDER CERTAIN CIRCUMSTANCES WHERE PRIOR AUTHORIZATION HAS BEEN CONFIRMED. THIS PROCEDURE IS GENERALLY CONSIDERED TO BE A  COSMETIC PROCEDURE BY INSURANCE COMPANIES.

## 2020-02-26 DIAGNOSIS — Z23 Encounter for immunization: Secondary | ICD-10-CM | POA: Diagnosis not present

## 2020-03-01 ENCOUNTER — Other Ambulatory Visit: Payer: Self-pay

## 2020-03-01 ENCOUNTER — Ambulatory Visit (INDEPENDENT_AMBULATORY_CARE_PROVIDER_SITE_OTHER): Payer: BC Managed Care – PPO | Admitting: Gastroenterology

## 2020-03-01 VITALS — BP 138/83 | HR 96 | Temp 97.7°F | Ht 66.0 in | Wt 159.0 lb

## 2020-03-01 DIAGNOSIS — K760 Fatty (change of) liver, not elsewhere classified: Secondary | ICD-10-CM | POA: Diagnosis not present

## 2020-03-01 NOTE — Progress Notes (Signed)
Jonathon Bellows MD, MRCP(U.K) [address]  [place]  [place], [place] [address]  Main: [phone]  Fax: [phone]   Primary Care Physician: Maryland Pink, MD  Primary Gastroenterologist:  Dr. Jonathon Bellows   Follow-up for abnormal LFTs  HPI: Rachael Carr is a 60 y.o. female    Summary of history :  Initially referred and seen on 01/14/2020 for abnormal LFTs.  Noted in November 2020.  Occasional use of alcohol.  No over-the-counter supplements but has had tattoos over her back many years ago.11/19/2019, ferritin 400 but iron percentage saturation normal, hepatitis B surface antibody nonreactive, hepatitis B surface antigen negative, hepatitis C virus antibody negative,  Interval history   01/14/2020-03/01/2020  01/21/2020: Right upper quadrant ultrasound showed status post cholecystectomy.  Common biliary duct dilation up to 12 mm.  No obstructing stone noted.  01/29/2020: MRI MRCP showed prior cholecystectomy with mild enlargement prominence of the common bile duct measuring 9 mm without any filling defect.  Mild diffuse hepatic steatosis.  01/14/2020: CK, hepatitis B core total antibody, hepatitis B E antigen/antibody, HIV, ANA, smooth muscle antibody, ceruloplasmin, immunoglobulins, celiac serology, alpha-1 antitrypsin, LK M antibody normal.  Hepatitis A total antibody positive, ferritin 339  She is doing well no complaints.  Has cut down significantly on alcohol intake.  Eating healthy.    Current Outpatient Medications  Medication Sig Dispense Refill  . albuterol (VENTOLIN HFA) 108 (90 Base) MCG/ACT inhaler Inhale into the lungs.    . Ascorbic Acid (VITAMIN C PO) Take by mouth.    . cetirizine (ZYRTEC) 10 MG tablet Take 10 mg by mouth daily.    . Cholecalciferol (VITAMIN D3) 1.25 MG (50000 UT) CAPS Take 1 capsule by mouth daily.    . Cinnamon 500 MG capsule Take 1,000 mg by mouth 2 (two) times daily.    . clindamycin (CLEOCIN-T) 1 % lotion Apply to affected areas lips  every morning until improved. 60 mL 0  . doxycycline (MONODOX) 100 MG capsule Take 1 capsule (100 mg total) by mouth daily. Take with food. 30 capsule 1  . fluticasone (FLONASE) 50 MCG/ACT nasal spray Place into both nostrils daily.    Marland Kitchen ibuprofen (ADVIL,MOTRIN) 600 MG tablet Take 1 tablet (600 mg total) by mouth every 6 (six) hours as needed for mild pain or cramping. 30 tablet 0  . Multiple Minerals-Vitamins (CITRACAL PLUS PO) Take 1 tablet by mouth daily.    . Probiotic Product (PROBIOTIC-10 PO) Take 1 tablet by mouth daily.    Corrie Dandy (KERYDIN) 5 % SOLN Apply to affected toenail every night until improved. 10 mL 5  . Turmeric 500 MG TABS Take 1 tablet by mouth daily.     No current facility-administered medications for this visit.    Allergies as of 03/01/2020 - Review Complete 03/01/2020  Allergen Reaction Noted  . Demerol [meperidine] Other (See Comments) 09/20/2014  . Neosporin original [bacitracin-neomycin-polymyxin]  02/17/2020  . Codeine Palpitations 09/27/2015    ROS:  General: Negative for anorexia, weight loss, fever, chills, fatigue, weakness. ENT: Negative for hoarseness, difficulty swallowing , nasal congestion. CV: Negative for chest pain, angina, palpitations, dyspnea on exertion, peripheral edema.  Respiratory: Negative for dyspnea at rest, dyspnea on exertion, cough, sputum, wheezing.  GI: See history of present illness. GU:  Negative for dysuria, hematuria, urinary incontinence, urinary frequency, nocturnal urination.  Endo: Negative for unusual weight change.    Physical Examination:   BP 138/83   Pulse 96   Temp 97.7 F (  36.5 C)   Ht 5\' 6"  (1.676 m)   Wt 159 lb (72.1 kg)   BMI 25.66 kg/m   General: Well-nourished, well-developed in no acute distress.  Eyes: No icterus. Conjunctivae pink. Mouth: Oropharyngeal mucosa moist and pink , no lesions erythema or exudate. Lungs: Clear to auscultation bilaterally. Non-labored. Heart: Regular rate and  rhythm, no murmurs rubs or gallops.  Abdomen: Bowel sounds are normal, nontender, nondistended, no hepatosplenomegaly or masses, no abdominal bruits or hernia , no rebound or guarding.   Extremities: No lower extremity edema. No clubbing or deformities. Neuro: Alert and oriented x 3.  Grossly intact. Skin: Warm and dry, no jaundice.   Psych: Alert and cooperative, normal mood and affect.   Imaging Studies: No results found.  Assessment and Plan:   Rachael Carr is a 60 y.o. y/o female here to follow-up for abnormal LFTs.  Imaging shows hepatic steatosis no biliary obstruction.  Autoimmune work-up has been negative as well as work-up for viral hepatitis.  She is immune to hepatitis A but not to hepatitis B.  Abnormal LFTs secondary to NAFLD.  Plan 1.  Complete hepatitis B immunization. 2.  Continue exercise modification of diet with suggestion to follow Mediterranean diet.  Would suggest to follow-up with our clinic regularly as there are new drugs in the pipeline which may be available over the next few years to treat nonalcoholic fatty liver disease.  Address cardiovascular risk factors 3.  Limit alcohol intake 4.  Repeat LFTs and CBC today and then in 8 to 10 months   Dr Jonathon Bellows  MD,MRCP [facility]) Follow up in 8 to 10 months

## 2020-03-02 ENCOUNTER — Encounter: Payer: Self-pay | Admitting: Gastroenterology

## 2020-03-02 LAB — CBC
Hematocrit: 41.3 % (ref 34.0–46.6)
Hemoglobin: 13.9 g/dL (ref 11.1–15.9)
MCH: 29.5 pg (ref 26.6–33.0)
MCHC: 33.7 g/dL (ref 31.5–35.7)
MCV: 88 fL (ref 79–97)
Platelets: 336 10*3/uL (ref 150–450)
RBC: 4.71 x10E6/uL (ref 3.77–5.28)
RDW: 12 % (ref 11.7–15.4)
WBC: 6.4 10*3/uL (ref 3.4–10.8)

## 2020-03-02 LAB — HEPATIC FUNCTION PANEL
ALT: 43 IU/L — ABNORMAL HIGH (ref 0–32)
AST: 33 IU/L (ref 0–40)
Albumin: 4.6 g/dL (ref 3.8–4.8)
Alkaline Phosphatase: 89 IU/L (ref 44–121)
Bilirubin Total: 0.3 mg/dL (ref 0.0–1.2)
Bilirubin, Direct: 0.13 mg/dL (ref 0.00–0.40)
Total Protein: 7.2 g/dL (ref 6.0–8.5)

## 2020-03-02 NOTE — Progress Notes (Signed)
Inform LFT's have almost normalized- will check in 3-4 months as planned

## 2020-05-09 DIAGNOSIS — M67471 Ganglion, right ankle and foot: Secondary | ICD-10-CM | POA: Diagnosis not present

## 2020-07-22 DIAGNOSIS — Z23 Encounter for immunization: Secondary | ICD-10-CM | POA: Diagnosis not present

## 2020-09-19 ENCOUNTER — Other Ambulatory Visit: Payer: Self-pay | Admitting: Obstetrics and Gynecology

## 2020-09-19 DIAGNOSIS — Z1231 Encounter for screening mammogram for malignant neoplasm of breast: Secondary | ICD-10-CM

## 2020-09-19 NOTE — Progress Notes (Signed)
o

## 2020-10-21 DIAGNOSIS — Z23 Encounter for immunization: Secondary | ICD-10-CM | POA: Diagnosis not present

## 2020-11-01 ENCOUNTER — Ambulatory Visit: Payer: BC Managed Care – PPO | Admitting: Gastroenterology

## 2020-11-07 ENCOUNTER — Ambulatory Visit: Payer: BC Managed Care – PPO | Admitting: Gastroenterology

## 2020-11-07 ENCOUNTER — Encounter: Payer: Self-pay | Admitting: Gastroenterology

## 2020-11-07 ENCOUNTER — Other Ambulatory Visit: Payer: Self-pay

## 2020-11-07 ENCOUNTER — Ambulatory Visit (INDEPENDENT_AMBULATORY_CARE_PROVIDER_SITE_OTHER): Payer: BC Managed Care – PPO | Admitting: Gastroenterology

## 2020-11-07 VITALS — BP 127/76 | HR 103 | Temp 98.3°F | Ht 66.0 in | Wt 159.6 lb

## 2020-11-07 DIAGNOSIS — R7989 Other specified abnormal findings of blood chemistry: Secondary | ICD-10-CM | POA: Diagnosis not present

## 2020-11-07 DIAGNOSIS — K760 Fatty (change of) liver, not elsewhere classified: Secondary | ICD-10-CM

## 2020-11-07 NOTE — Progress Notes (Signed)
Jonathon Bellows MD, MRCP(U.K) [address]  [place]  [place], [place] [address]  Main: [phone]  Fax: [phone]   Primary Care Physician: Maryland Pink, MD  Primary Gastroenterologist:  Dr. Jonathon Bellows   Chief Complaint  Patient presents with   NALD    HPI: Rachael Carr is a 60 y.o. female  Summary of history :   Initially referred and seen on 01/14/2020 for abnormal LFTs.  Noted in November 2020.  Occasional use of alcohol.  No over-the-counter supplements but has had tattoos over her back many years ago.11/19/2019, ferritin 400 but iron percentage saturation normal, hepatitis B surface antibody nonreactive, hepatitis B surface antigen negative, hepatitis C virus antibody negative, 01/21/2020: Right upper quadrant ultrasound showed status post cholecystectomy.  Common biliary duct dilation up to 12 mm.  No obstructing stone noted.   01/29/2020: MRI MRCP showed prior cholecystectomy with mild enlargement prominence of the common bile duct measuring 9 mm without any filling defect.  Mild diffuse hepatic steatosis.   01/14/2020: CK, hepatitis B core total antibody, hepatitis B E antigen/antibody, HIV, ANA, smooth muscle antibody, ceruloplasmin, immunoglobulins, celiac serology, alpha-1 antitrypsin, LK M antibody normal.   Hepatitis A total antibody positive, ferritin 339    Interval history   03/01/2020-11/07/2020   No change in weight.  Stable.  She is doing well no new complaints.  Has significantly cut down on alcohol intake.  Occasionally has a drink.  Is aware of a Mediterranean diet which we talked about the last time and is trying to follow.  No new complaints.  She completed hepatitis B vaccination series.      Allergies as of 11/07/2020 - Review Complete 11/07/2020  Allergen Reaction Noted   Neosporin original [bacitracin-neomycin-polymyxin]  02/17/2020   Codeine Palpitations 09/27/2015    ROS:  General: Negative for anorexia, weight loss, fever, chills,  fatigue, weakness. ENT: Negative for hoarseness, difficulty swallowing , nasal congestion. CV: Negative for chest pain, angina, palpitations, dyspnea on exertion, peripheral edema.  Respiratory: Negative for dyspnea at rest, dyspnea on exertion, cough, sputum, wheezing.  GI: See history of present illness. GU:  Negative for dysuria, hematuria, urinary incontinence, urinary frequency, nocturnal urination.  Endo: Negative for unusual weight change.    Physical Examination:   BP 127/76   Pulse (!) 103   Temp 98.3 F (36.8 C) (Oral)   Ht 5\' 6"  (1.676 m)   Wt 159 lb 9.6 oz (72.4 kg)   BMI 25.76 kg/m   General: Well-nourished, well-developed in no acute distress.  Eyes: No icterus. Conjunctivae pink. Skin: Warm and dry, no jaundice.   Psych: Alert and cooperative, normal mood and affect.   Imaging Studies: No results found.  Assessment and Plan:   Rachael Carr is a 60 y.o. y/o female  here to follow-up for abnormal LFTs.  Imaging shows hepatic steatosis no biliary obstruction.  Autoimmune work-up has been negative as well as work-up for viral hepatitis.  She is immune to hepatitis A .  Completed hepatitis B vaccination series abnormal LFTs secondary to NAFLD.   Plan 1.  Completed hepatitis B vaccination we will check if her surface antibody level is suggestive of immunity 2.  Continue exercise modification of diet with suggestion to follow Mediterranean diet.  Would suggest to follow-up with our clinic regularly as there are new drugs in the pipeline which may be available over the next few years to treat nonalcoholic fatty liver disease.  Address cardiovascular risk factors 3.  Limit alcohol intake 4.  Repeat LFTs and CBC today     Dr Jonathon Bellows  MD,MRCP [facility]) Follow up in  As needed

## 2020-11-08 ENCOUNTER — Telehealth: Payer: Self-pay | Admitting: Gastroenterology

## 2020-11-08 LAB — CBC WITH DIFFERENTIAL/PLATELET
Basophils Absolute: 0.1 10*3/uL (ref 0.0–0.2)
Basos: 1 %
EOS (ABSOLUTE): 0.1 10*3/uL (ref 0.0–0.4)
Eos: 1 %
Hematocrit: 44.2 % (ref 34.0–46.6)
Hemoglobin: 14.7 g/dL (ref 11.1–15.9)
Immature Grans (Abs): 0 10*3/uL (ref 0.0–0.1)
Immature Granulocytes: 0 %
Lymphocytes Absolute: 2 10*3/uL (ref 0.7–3.1)
Lymphs: 33 %
MCH: 29.2 pg (ref 26.6–33.0)
MCHC: 33.3 g/dL (ref 31.5–35.7)
MCV: 88 fL (ref 79–97)
Monocytes Absolute: 0.4 10*3/uL (ref 0.1–0.9)
Monocytes: 7 %
Neutrophils Absolute: 3.6 10*3/uL (ref 1.4–7.0)
Neutrophils: 58 %
Platelets: 371 10*3/uL (ref 150–450)
RBC: 5.04 x10E6/uL (ref 3.77–5.28)
RDW: 12.1 % (ref 11.7–15.4)
WBC: 6.1 10*3/uL (ref 3.4–10.8)

## 2020-11-08 LAB — HEPATIC FUNCTION PANEL
ALT: 50 IU/L — ABNORMAL HIGH (ref 0–32)
AST: 32 IU/L (ref 0–40)
Albumin: 5 g/dL — ABNORMAL HIGH (ref 3.8–4.8)
Alkaline Phosphatase: 105 IU/L (ref 44–121)
Bilirubin Total: 0.4 mg/dL (ref 0.0–1.2)
Bilirubin, Direct: 0.12 mg/dL (ref 0.00–0.40)
Total Protein: 7.4 g/dL (ref 6.0–8.5)

## 2020-11-08 LAB — HEPATITIS B SURFACE ANTIBODY,QUALITATIVE: Hep B Surface Ab, Qual: REACTIVE

## 2020-11-08 NOTE — Telephone Encounter (Signed)
Pt. Calling to discuss results of blood tests

## 2020-11-08 NOTE — Telephone Encounter (Signed)
Patient was contacted and informed her of her lab results per Dr. Georgeann Oppenheim comments below. Patient understood and had no further questions.

## 2020-11-08 NOTE — Telephone Encounter (Signed)
Dr. Vicente Males, can you please review her labs and let me know what you need me to tell her. Thank you.

## 2020-11-11 ENCOUNTER — Ambulatory Visit: Payer: BC Managed Care – PPO | Admitting: Obstetrics and Gynecology

## 2020-11-14 DIAGNOSIS — E782 Mixed hyperlipidemia: Secondary | ICD-10-CM | POA: Insufficient documentation

## 2020-11-14 NOTE — Progress Notes (Signed)
PCP: Maryland Pink, MD   Chief Complaint  Patient presents with   Gynecologic Exam    No concerns    HPI:      [name] is a 60 y.o. [phone] who LMP was No LMP recorded. Patient is postmenopausal., presents today for her annual examination.  Her menses are absent due to menopause. She does not have PMB. She is s/p D&C/polypectomy/hysteroscopy 9/17 for PMB. PMB resolved after procedure.  Sex activity: rarely with single partner, contraception - post menopausal status. She does have vaginal dryness. She has tried lubricants without relief. Used vag ERT in the past with sx relief and would like to try it again. Rx'd premarin vag ERT last yr but too expensive.  Last Pap: 11/15/16 Results were: no abnormalities /neg HPV DNA.  Hx of STDs: none  Last mammogram: 11/10/19  Results were: normal repeat in 12 months. Pt has appt today. Hx of LT breast [place] on bx 12/18. Hx of lipoma on LT breast per mammo/u/s.  There is no FH of breast cancer. There is no FH of ovarian cancer. Father had pancreatic cancer, pt declined cancer genetic testing past few yrs. The patient does do self-breast exams.  Colonoscopy: colonoscopy 2/19 without abnormalities with Dr. Gustavo Lah. Repeat due after 10 years.   Tobacco use: The patient denies current or previous tobacco use. Alcohol use: social drinker  No drug use.  Exercise: moderately active  She does get adequate calcium and Vitamin D in her diet.  Borderline lipids 10/21, hx of fatty liver disease, eval by GI. Pt has FH of this as well.   Pt needs RF on albuterol, uses occas. Hx of seasonal allergies.  Past Medical History:  Diagnosis Date   Abnormal mammogram 11/2016   PASH LT breast   Allergy    Depression    Dysplasia of cervix, low grade (CIN 1) 2009   Elevated LFTs    Insomnia     Past Surgical History:  Procedure Laterality Date   BREAST BIOPSY Left 12/10/2016   [place] bx FIBROCYSTIC CHANGE, [place]   CHOLECYSTECTOMY  1989    COLONOSCOPY  2013   polyp; repeat in 5 yrs.   COLONOSCOPY  2019   DILATATION & CURETTAGE/HYSTEROSCOPY WITH MYOSURE N/A 09/27/2015   Procedure: DILATATION & CURETTAGE/HYSTEROSCOPY WITH MYOSURE;  Surgeon: Will Bonnet, MD;  Location: [facility] ORS;  Service: Gynecology;  Laterality: N/A;   FOOT SURGERY Left 2006   TUBAL LIGATION     VAGINA SURGERY  1989    Family History  Problem Relation Age of Onset   Congestive Heart Failure Mother    Diabetes Mother        DM Type 2   Pancreatic cancer Father 13   Hypertension Father    Other Sister        elevated LFTs   Other Sister        elevated LFTs   Bone cancer Maternal Uncle 63   Throat cancer Maternal Uncle 45   Stomach cancer Maternal Grandmother    Breast cancer Neg Hx     Social History   Socioeconomic History   Marital status: Married    Spouse name: Not on file   Number of children: Not on file   Years of education: Not on file   Highest education level: Not on file  Occupational History   Not on file  Tobacco Use   Smoking status: Former    Years: 8.00    Types: Cigarettes  Smokeless tobacco: Never   Tobacco comments:    QUIT IN 1980'S  Vaping Use   Vaping Use: Never used  Substance and Sexual Activity   Alcohol use: Yes    Alcohol/week: 0.0 standard drinks    Comment: OCCASIONALLY   Drug use: No   Sexual activity: Yes    Birth control/protection: Post-menopausal  Other Topics Concern   Not on file  Social History Narrative   Not on file   Social Determinants of Health   Financial Resource Strain: Not on file  Food Insecurity: Not on file  Transportation Needs: Not on file  Physical Activity: Not on file  Stress: Not on file  Social Connections: Not on file  Intimate Partner Violence: Not on file    Current Meds  Medication Sig   Ascorbic Acid (VITAMIN C PO) Take by mouth.   cetirizine (ZYRTEC) 10 MG tablet Take 10 mg by mouth daily.   Cholecalciferol (VITAMIN D3) 1.25 MG (50000 UT) CAPS Take  1 capsule by mouth daily.   Cinnamon 500 MG capsule Take 1,000 mg by mouth 2 (two) times daily.   estradiol (ESTRACE) 0.1 MG/GM vaginal cream Insert 1g nightly for 1 wk, then 1 g once weekly as maintenace   fluticasone (FLONASE) 50 MCG/ACT nasal spray Place into both nostrils daily.   Probiotic Product (PROBIOTIC-10 PO) Take 1 tablet by mouth daily.   Turmeric 500 MG TABS Take 1 tablet by mouth daily.      ROS:  Review of Systems  Constitutional:  Negative for fatigue, fever and unexpected weight change.  Respiratory:  Negative for cough, shortness of breath and wheezing.   Cardiovascular:  Negative for chest pain, palpitations and leg swelling.  Gastrointestinal:  Negative for blood in stool, constipation, diarrhea, nausea and vomiting.  Endocrine: Negative for cold intolerance, heat intolerance and polyuria.  Genitourinary:  Positive for dyspareunia. Negative for dysuria, flank pain, frequency, genital sores, hematuria, menstrual problem, pelvic pain, urgency, vaginal bleeding, vaginal discharge and vaginal pain.  Musculoskeletal:  Negative for back pain, joint swelling and myalgias.  Skin:  Negative for rash.  Neurological:  Negative for dizziness, syncope, light-headedness, numbness and headaches.  Hematological:  Negative for adenopathy.  Psychiatric/Behavioral:  Negative for agitation, confusion, sleep disturbance and suicidal ideas. The patient is not nervous/anxious.  BREAST: Mass   Objective: BP 124/80   Ht 5\' 6"  (1.676 m)   Wt 161 lb (73 kg)   BMI 25.99 kg/m    Physical Exam Constitutional:      Appearance: She is well-developed.  Genitourinary:     Vulva normal.     Right Labia: No rash, tenderness or lesions.    Left Labia: No tenderness, lesions or rash.    No vaginal discharge, erythema or tenderness.      Right Adnexa: not tender and no mass present.    Left Adnexa: not tender and no mass present.    No cervical friability or polyp.     Uterus is not  enlarged or tender.  Breasts:    Right: No mass, nipple discharge, skin change or tenderness.     Left: No mass, nipple discharge, skin change or tenderness.  Neck:     Thyroid: No thyromegaly.  Cardiovascular:     Rate and Rhythm: Normal rate and regular rhythm.     Heart sounds: Normal heart sounds. No murmur heard. Pulmonary:     Effort: Pulmonary effort is normal.     Breath sounds: Normal breath sounds.  Abdominal:  Palpations: Abdomen is soft.     Tenderness: There is no abdominal tenderness. There is no guarding or rebound.  Musculoskeletal:        General: Normal range of motion.     Cervical back: Normal range of motion.  Lymphadenopathy:     Cervical: No cervical adenopathy.  Neurological:     General: No focal deficit present.     Mental Status: She is alert and oriented to person, place, and time.     Cranial Nerves: No cranial nerve deficit.  Skin:    General: Skin is warm and dry.  Psychiatric:        Mood and Affect: Mood normal.        Behavior: Behavior normal.        Thought Content: Thought content normal.        Judgment: Judgment normal.  Vitals reviewed.    Assessment/Plan:  Encounter for annual routine gynecological examination  Encounter for screening mammogram for malignant neoplasm of breast; pt has mammo today  Mixed hyperlipidemia - Plan: Lipid panel, Lipid panel; recheck labs today.  Elevated LFTs--followed by GI  Family history of pancreatic cancer--MyRisk testing discussed and pt declines. F/u prn.   Postmenopausal atrophic vaginitis - Plan: estradiol (ESTRACE) 0.1 MG/GM vaginal cream; try estrace crm due to cost. Can do [email or web address] Rx  Dyspareunia in female - Plan: estradiol (ESTRACE) 0.1 MG/GM vaginal cream  Seasonal allergic rhinitis, unspecified trigger - Plan: albuterol (VENTOLIN HFA) 108 (90 Base) MCG/ACT inhaler; Rx RF albuterol prn.     Meds ordered this encounter  Medications   albuterol (VENTOLIN HFA) 108 (90 Base)  MCG/ACT inhaler    Sig: Inhale 2 puffs into the lungs every 6 (six) hours as needed for wheezing or shortness of breath.    Dispense:  1 each    Refill:  3    Order Specific Question:   Supervising Provider    Answer:   Gae Dry [[id number]]   estradiol (ESTRACE) 0.1 MG/GM vaginal cream    Sig: Insert 1g nightly for 1 wk, then 1 g once weekly as maintenace    Dispense:  42.5 g    Refill:  1    Order Specific Question:   Supervising Provider    Answer:   Gae Dry [[id number]]          GYN counsel mammography screening, menopause, adequate intake of calcium and vitamin D, diet and exercise    F/U  Return in about 1 year (around 11/15/2021).  Asta Corbridge B. Tayah Idrovo, PA-C 11/15/2020 9:43 AM

## 2020-11-15 ENCOUNTER — Ambulatory Visit
Admission: RE | Admit: 2020-11-15 | Discharge: 2020-11-15 | Disposition: A | Discharge status: Home / Self Care | Payer: BC Managed Care – PPO | Source: Ambulatory Visit | Attending: Obstetrics and Gynecology | Admitting: Obstetrics and Gynecology

## 2020-11-15 ENCOUNTER — Encounter: Payer: Self-pay | Admitting: Obstetrics and Gynecology

## 2020-11-15 ENCOUNTER — Other Ambulatory Visit: Payer: Self-pay

## 2020-11-15 ENCOUNTER — Ambulatory Visit (INDEPENDENT_AMBULATORY_CARE_PROVIDER_SITE_OTHER): Payer: BC Managed Care – PPO | Admitting: Obstetrics and Gynecology

## 2020-11-15 VITALS — BP 124/80 | Ht 66.0 in | Wt 161.0 lb

## 2020-11-15 DIAGNOSIS — Z800 Family history of malignant neoplasm of digestive organs: Secondary | ICD-10-CM

## 2020-11-15 DIAGNOSIS — E782 Mixed hyperlipidemia: Secondary | ICD-10-CM

## 2020-11-15 DIAGNOSIS — R7989 Other specified abnormal findings of blood chemistry: Secondary | ICD-10-CM | POA: Diagnosis not present

## 2020-11-15 DIAGNOSIS — N941 Unspecified dyspareunia: Secondary | ICD-10-CM

## 2020-11-15 DIAGNOSIS — Z01419 Encounter for gynecological examination (general) (routine) without abnormal findings: Secondary | ICD-10-CM

## 2020-11-15 DIAGNOSIS — J302 Other seasonal allergic rhinitis: Secondary | ICD-10-CM

## 2020-11-15 DIAGNOSIS — Z1231 Encounter for screening mammogram for malignant neoplasm of breast: Secondary | ICD-10-CM

## 2020-11-15 DIAGNOSIS — N952 Postmenopausal atrophic vaginitis: Secondary | ICD-10-CM

## 2020-11-15 DIAGNOSIS — H524 Presbyopia: Secondary | ICD-10-CM | POA: Diagnosis not present

## 2020-11-15 MED ORDER — ESTRADIOL 0.1 MG/GM VA CREA: TOPICAL_CREAM | VAGINAL | 1 refills | Status: DC

## 2020-11-15 MED ORDER — ALBUTEROL SULFATE HFA 108 (90 BASE) MCG/ACT IN AERS: 2.0000 | INHALATION_SPRAY | Freq: Four times a day (QID) | RESPIRATORY_TRACT | 3 refills | Status: DC | PRN

## 2020-11-15 NOTE — Patient Instructions (Signed)
I value your feedback and you entrusting us with your care. If you get a [facility] patient survey, I would appreciate you taking the time to let us know about your experience today. Thank you! ? ? ?

## 2020-11-16 ENCOUNTER — Ambulatory Visit (INDEPENDENT_AMBULATORY_CARE_PROVIDER_SITE_OTHER): Payer: BC Managed Care – PPO | Admitting: Dermatology

## 2020-11-16 DIAGNOSIS — L820 Inflamed seborrheic keratosis: Secondary | ICD-10-CM

## 2020-11-16 DIAGNOSIS — L720 Epidermal cyst: Secondary | ICD-10-CM

## 2020-11-16 DIAGNOSIS — L814 Other melanin hyperpigmentation: Secondary | ICD-10-CM

## 2020-11-16 LAB — LIPID PANEL
Chol/HDL Ratio: 3.3 ratio (ref 0.0–4.4)
Cholesterol, Total: 228 mg/dL — ABNORMAL HIGH (ref 100–199)
HDL: 69 mg/dL (ref >39)
LDL Chol Calc (NIH): 137 mg/dL — ABNORMAL HIGH (ref 0–99)
Triglycerides: 127 mg/dL (ref 0–149)
VLDL Cholesterol Cal: 22 mg/dL (ref 5–40)

## 2020-11-16 NOTE — Patient Instructions (Addendum)
Pre-Operative Instructions  You are scheduled for a surgical procedure at [facility]. We recommend you read the following instructions. If you have any questions or concerns, please call the office at [phone].  Shower and wash the entire body with soap and water the day of your surgery paying special attention to cleansing at and around the planned surgery site.  Avoid aspirin or aspirin containing products at least fourteen (14) days prior to your surgical procedure and for at least one week (7 Days) after your surgical procedure. If you take aspirin on a regular basis for heart disease or history of stroke or for any other reason, we may recommend you continue taking aspirin but please notify us if you take this on a regular basis. Aspirin can cause more bleeding to occur during surgery as well as prolonged bleeding and bruising after surgery.   Avoid other nonsteroidal pain medications at least one week prior to surgery and at least one week prior to your surgery. These include medications such as Ibuprofen (Motrin, Advil and Nuprin), Naprosyn, Voltaren, Relafen, etc. If medications are used for therapeutic reasons, please inform us as they can cause increased bleeding or prolonged bleeding during and bruising after surgical procedures.   Please advise [place] if you are taking any "blood thinner" medications such as Coumadin or Dipyridamole or Plavix or similar medications. These cause increased bleeding and prolonged bleeding during procedures and bruising after surgical procedures. We may have to consider discontinuing these medications briefly prior to and shortly after your surgery if safe to do so.   Please inform us of all medications you are currently taking. All medications that are taken regularly should be taken the day of surgery as you always do. Nevertheless, we need to be informed of what medications you are taking prior to surgery to know whether they will affect the  procedure or cause any complications.   Please inform us of any medication allergies. Also inform us of whether you have allergies to Latex or rubber products or whether you have had any adverse reaction to Lidocaine or Epinephrine.  Please inform us of any prosthetic or artificial body parts such as artificial heart valve, joint replacements, etc., or similar condition that might require preoperative antibiotics.   We recommend avoidance of alcohol at least two weeks prior to surgery and continued avoidance for at least two weeks after surgery.   We recommend discontinuation of tobacco smoking at least two weeks prior to surgery and continued abstinence for at least two weeks after surgery.  Do not plan strenuous exercise, strenuous work or strenuous lifting for approximately four weeks after your surgery.   We request if you are unable to make your scheduled surgical appointment, please call us at least a week in advance or as soon as you are aware of a problem so that we can cancel or reschedule the appointment.   You MAY TAKE TYLENOL (acetaminophen) for pain as it is not a blood thinner.   PLEASE PLAN TO BE IN TOWN FOR TWO WEEKS FOLLOWING SURGERY, THIS IS IMPORTANT SO YOU CAN BE CHECKED FOR DRESSING CHANGES, SUTURE REMOVAL AND TO MONITOR FOR POSSIBLE COMPLICATIONS.    Cryotherapy Aftercare  Wash gently with soap and water everyday.   Apply Vaseline and Band-Aid daily until healed.    If you have any questions or concerns for your doctor, please call our main line at [phone] and press option 4 to reach your doctor's medical assistant. If no one answers,  please leave a voicemail as directed and we will return your call as soon as possible. Messages left after 4 pm will be answered the following business day.   You may also send [place] a message via [place]. We typically respond to MyChart messages within 1-2 business days.  For prescription refills, please ask your pharmacy to contact  our office. Our fax number is [phone].  If you have an urgent issue when the clinic is closed that cannot wait until the next business day, you can page your doctor at the number below.    Please note that while we do our best to be available for urgent issues outside of office hours, we are not available 24/7.   If you have an urgent issue and are unable to reach [place], you may choose to seek medical care at your doctor's office, retail clinic, urgent care center, or emergency room.  If you have a medical emergency, please immediately call 911 or go to the emergency department.  Pager Numbers  - Dr. Nehemiah Massed: [phone]  - Dr. Laurence Ferrari: [phone]  - Dr. Nicole Kindred: [phone]  In the event of inclement weather, please call our main line at [phone] for an update on the status of any delays or closures.  Dermatology Medication Tips: Please keep the boxes that topical medications come in in order to help keep track of the instructions about where and how to use these. Pharmacies typically print the medication instructions only on the boxes and not directly on the medication tubes.   If your medication is too expensive, please contact our office at [phone] option 4 or send [place] a message through [place].   We are unable to tell what your co-pay for medications will be in advance as this is different depending on your insurance coverage. However, we may be able to find a substitute medication at lower cost or fill out paperwork to get insurance to cover a needed medication.   If a prior authorization is required to get your medication covered by your insurance company, please allow [place] 1-2 business days to complete this process.  Drug prices often vary depending on where the prescription is filled and some pharmacies may offer cheaper prices.  The website www.goodrx.com contains coupons for medications through different pharmacies. The prices here do not account for what the cost  may be with help from insurance (it may be cheaper with your insurance), but the website can give you the [place] if you did not use any insurance.  - You can print the associated coupon and take it with your prescription to the pharmacy.  - You may also stop by our office during regular business hours and pick up a GoodRx coupon card.  - If you need your prescription sent electronically to a different pharmacy, notify our office through [facility] or by phone at [phone] option 4.

## 2020-11-16 NOTE — Progress Notes (Signed)
   Follow-Up Visit   Subjective  Rachael Carr is a 60 y.o. female who presents for the following: Pink growth (X few months, sore, irritated.) and cyst (Back, yrs, daughter squeezes and fills back up.). Wants it removed.   The following portions of the chart were reviewed this encounter and updated as appropriate:       Review of Systems:  No other skin or systemic complaints except as noted in HPI or Assessment and Plan.  Objective  Well appearing patient in no apparent distress; mood and affect are within normal limits.  A focused examination was performed including chest, back. Relevant physical exam findings are noted in the Assessment and Plan.  R intermammary chest x 2 (2) Erythematous keratotic or waxy stuck-on papule  Right Upper Back 7.9mm firm subcutaneous nodule   Assessment & Plan  Inflamed seborrheic keratosis R intermammary chest x 2  Destruction of lesion - R intermammary chest x 2  Destruction method: cryotherapy   Informed consent: discussed and consent obtained   Lesion destroyed using liquid nitrogen: Yes   Region frozen until ice ball extended beyond lesion: Yes   Outcome: patient tolerated procedure well with no complications   Post-procedure details: wound care instructions given   Additional details:  Prior to procedure, discussed risks of blister formation, small wound, skin dyspigmentation, or rare scar following cryotherapy. Recommend Vaseline ointment to treated areas while healing.   Epidermal inclusion cyst Right Upper Back  Benign-appearing. Exam most consistent with an epidermal inclusion cyst. Discussed that a cyst is a benign growth that can grow over time and sometimes get irritated or inflamed. Cyst with symptoms and/or recent change.  Discussed surgical excision to remove, including resulting scar and possible recurrence.  Patient will schedule for surgery. Pre-op information given.   Lentigines - Scattered tan macules chest - Due  to sun exposure - Benign-appearing, observe - Recommend daily broad spectrum sunscreen SPF 30+ to sun-exposed areas, reapply every 2 hours as needed. - Call for any changes   Return for cyst excision right upper back.  IJamesetta Orleans, CMA, am acting as scribe for Brendolyn Patty, MD .  Documentation: I have reviewed the above documentation for accuracy and completeness, and I agree with the above.  Brendolyn Patty MD

## 2020-11-17 ENCOUNTER — Other Ambulatory Visit: Payer: Self-pay

## 2020-11-17 DIAGNOSIS — J302 Other seasonal allergic rhinitis: Secondary | ICD-10-CM

## 2020-11-17 DIAGNOSIS — N941 Unspecified dyspareunia: Secondary | ICD-10-CM

## 2020-11-17 DIAGNOSIS — N952 Postmenopausal atrophic vaginitis: Secondary | ICD-10-CM

## 2020-11-17 MED ORDER — ALBUTEROL SULFATE HFA 108 (90 BASE) MCG/ACT IN AERS: 2.0000 | INHALATION_SPRAY | Freq: Four times a day (QID) | RESPIRATORY_TRACT | 3 refills | Status: DC | PRN

## 2020-11-17 MED ORDER — ESTRADIOL 0.1 MG/GM VA CREA: TOPICAL_CREAM | VAGINAL | 1 refills | Status: DC

## 2020-11-17 NOTE — Telephone Encounter (Signed)
Pt called and asked if her albuterol and estradiol vag cream Rxs could be sent to [facility] on [address] because they are cheaper there (originally sent to [facility] in [place]). ABC approved, I have resent them. Pt aware.

## 2020-12-26 DIAGNOSIS — H6121 Impacted cerumen, right ear: Secondary | ICD-10-CM | POA: Diagnosis not present

## 2021-01-23 ENCOUNTER — Encounter: Payer: BC Managed Care – PPO | Admitting: Dermatology

## 2021-02-21 ENCOUNTER — Ambulatory Visit (INDEPENDENT_AMBULATORY_CARE_PROVIDER_SITE_OTHER): Payer: BC Managed Care – PPO | Admitting: Dermatology

## 2021-02-21 ENCOUNTER — Other Ambulatory Visit: Payer: Self-pay

## 2021-02-21 DIAGNOSIS — D225 Melanocytic nevi of trunk: Secondary | ICD-10-CM

## 2021-02-21 DIAGNOSIS — D229 Melanocytic nevi, unspecified: Secondary | ICD-10-CM

## 2021-02-21 DIAGNOSIS — L720 Epidermal cyst: Secondary | ICD-10-CM

## 2021-02-21 DIAGNOSIS — Z1283 Encounter for screening for malignant neoplasm of skin: Secondary | ICD-10-CM

## 2021-02-21 DIAGNOSIS — L578 Other skin changes due to chronic exposure to nonionizing radiation: Secondary | ICD-10-CM | POA: Diagnosis not present

## 2021-02-21 DIAGNOSIS — L813 Cafe au lait spots: Secondary | ICD-10-CM

## 2021-02-21 DIAGNOSIS — L821 Other seborrheic keratosis: Secondary | ICD-10-CM

## 2021-02-21 DIAGNOSIS — L814 Other melanin hyperpigmentation: Secondary | ICD-10-CM

## 2021-02-21 NOTE — Patient Instructions (Addendum)
Recommend daily broad spectrum sunscreen SPF 30+ to sun-exposed areas, reapply every 2 hours as needed. Call for new or changing lesions.  Staying in the shade or wearing long sleeves, sun glasses (UVA+UVB protection) and wide brim hats (4-inch brim around the entire circumference of the hat) are also recommended for sun protection.     If You Need Anything After Your Visit  If you have any questions or concerns for your doctor, please call our main line at [phone] and press option 4 to reach your doctor's medical assistant. If no one answers, please leave a voicemail as directed and we will return your call as soon as possible. Messages left after 4 pm will be answered the following business day.   You may also send [place] a message via [place]. We typically respond to MyChart messages within 1-2 business days.  For prescription refills, please ask your pharmacy to contact our office. Our fax number is [phone].  If you have an urgent issue when the clinic is closed that cannot wait until the next business day, you can page your doctor at the number below.    Please note that while we do our best to be available for urgent issues outside of office hours, we are not available 24/7.   If you have an urgent issue and are unable to reach [place], you may choose to seek medical care at your doctor's office, retail clinic, urgent care center, or emergency room.  If you have a medical emergency, please immediately call 911 or go to the emergency department.  Pager Numbers  - Dr. Nehemiah Massed: [phone]  - Dr. Laurence Ferrari: [phone]  - Dr. Nicole Kindred: [phone]  In the event of inclement weather, please call our main line at [phone] for an update on the status of any delays or closures.  Dermatology Medication Tips: Please keep the boxes that topical medications come in in order to help keep track of the instructions about where and how to use these. Pharmacies typically print the medication  instructions only on the boxes and not directly on the medication tubes.   If your medication is too expensive, please contact our office at [phone] option 4 or send [place] a message through [place].   We are unable to tell what your co-pay for medications will be in advance as this is different depending on your insurance coverage. However, we may be able to find a substitute medication at lower cost or fill out paperwork to get insurance to cover a needed medication.   If a prior authorization is required to get your medication covered by your insurance company, please allow [place] 1-2 business days to complete this process.  Drug prices often vary depending on where the prescription is filled and some pharmacies may offer cheaper prices.  The website www.goodrx.com contains coupons for medications through different pharmacies. The prices here do not account for what the cost may be with help from insurance (it may be cheaper with your insurance), but the website can give you the [place] if you did not use any insurance.  - You can print the associated coupon and take it with your prescription to the pharmacy.  - You may also stop by our office during regular business hours and pick up a GoodRx coupon card.  - If you need your prescription sent electronically to a different pharmacy, notify our office through [facility] or by phone at [phone] option 4.     Si Usted Necesita Algo Despus de  Su Visita  Tambin puede enviarnos un mensaje a travs de MyChart. Por lo general respondemos a los mensajes de MyChart en el transcurso de 1 a 2 das hbiles.  Para renovar recetas, por favor pida a su farmacia que se ponga en contacto con nuestra oficina. Harland Dingwall de fax es [place] [phone].  Si tiene un asunto urgente cuando la clnica est cerrada y que no puede esperar hasta el siguiente da hbil, puede llamar/localizar a su doctor(a) al nmero que aparece a continuacin.   Por  favor, tenga en cuenta que aunque hacemos todo lo posible para estar disponibles para asuntos urgentes fuera del horario de [place], no estamos disponibles las 24 horas del da, los 7 das de la [place].   Si tiene un problema urgente y no puede comunicarse con nosotros, puede optar por buscar atencin mdica  en el consultorio de su doctor(a), en una clnica privada, en un centro de atencin urgente o en una sala de emergencias.  Si tiene Engineering geologist, por favor llame inmediatamente al 911 o vaya a la sala de emergencias.  Nmeros de bper  - Dr. Nehemiah Massed: [phone]  - Dra. Moye: [phone]  - Dra. Nicole Kindred: [phone]  En caso de inclemencias del [place], por favor llame a Johnsie Kindred principal al [phone] para una actualizacin sobre el [place] de cualquier retraso o cierre.  Consejos para la medicacin en dermatologa: Por favor, guarde las cajas en las que vienen los medicamentos de uso tpico para ayudarle a seguir las instrucciones sobre dnde y cmo usarlos. Las farmacias generalmente imprimen las instrucciones del medicamento slo en las cajas y no directamente en los tubos del [place].   Si su medicamento es muy caro, por favor, pngase en contacto con Zigmund Daniel llamando al [phone] y presione la opcin 4 o envenos un mensaje a travs de Pharmacist, community.   No podemos decirle cul ser su copago por los medicamentos por adelantado ya que esto es diferente dependiendo de la cobertura de su seguro. Sin embargo, es posible que podamos encontrar un medicamento sustituto a Electrical engineer un formulario para que el seguro cubra el medicamento que se considera necesario.   Si se requiere una autorizacin previa para que su compaa de seguros [place] su medicamento, por favor permtanos de 1 a 2 das hbiles para completar este proceso.  Los precios de los medicamentos varan con frecuencia dependiendo del Environmental consultant de dnde se surte la receta y alguna farmacias  pueden ofrecer precios ms baratos.  El sitio web www.goodrx.com tiene cupones para medicamentos de Airline pilot. Los precios aqu no tienen en cuenta lo que podra costar con la ayuda del seguro (puede ser ms barato con su seguro), pero el sitio web puede darle el precio si no utiliz Research scientist (physical sciences).  - Puede imprimir el cupn correspondiente y llevarlo con su receta a la farmacia.  - Tambin puede pasar por nuestra oficina durante el horario de atencin regular y Charity fundraiser una tarjeta de cupones de GoodRx.  - Si necesita que su receta se enve electrnicamente a una farmacia diferente, informe a nuestra oficina a travs de MyChart de  o por telfono llamando al [phone] y presione la opcin 4.

## 2021-02-21 NOTE — Progress Notes (Signed)
° °  Follow-Up Visit   Subjective  Rachael Carr is a 61 y.o. female who presents for the following: Annual Exam.  The patient presents for Total-Body Skin Exam (TBSE) for skin cancer screening and mole check.  The patient has spots, moles and lesions to be evaluated, some may be new or changing. She would like cyst of the right upper back checked again, no changes.    The following portions of the chart were reviewed this encounter and updated as appropriate:       Review of Systems:  No other skin or systemic complaints except as noted in HPI or Assessment and Plan.  Objective  Well appearing patient in no apparent distress; mood and affect are within normal limits.  A full examination was performed including scalp, head, eyes, ears, nose, lips, neck, chest, axillae, abdomen, back, buttocks, bilateral upper extremities, bilateral lower extremities, hands, feet, fingers, toes, fingernails, and toenails. All findings within normal limits unless otherwise noted below.  Right Upper Back Subcutaneous nodule, 8.0 mm  Left spinal Lower Back 2.78mm med dark brown macule    Assessment & Plan  Skin cancer screening performed today.  Actinic Damage - chronic, secondary to cumulative UV radiation exposure/sun exposure over time - diffuse scaly erythematous macules with underlying dyspigmentation - Recommend daily broad spectrum sunscreen SPF 30+ to sun-exposed areas, reapply every 2 hours as needed.  - Recommend staying in the shade or wearing long sleeves, sun glasses (UVA+UVB protection) and wide brim hats (4-inch brim around the entire circumference of the hat). - Call for new or changing lesions.  Lentigines - Scattered tan macules - Due to sun exposure - Benign-appering, observe - Recommend daily broad spectrum sunscreen SPF 30+ to sun-exposed areas, reapply every 2 hours as needed. - Call for any changes  Melanocytic Nevi - Tan-brown and/or pink-flesh-colored symmetric  macules and papules - Benign appearing on exam today - Observation - Call clinic for new or changing moles - Recommend daily use of broad spectrum spf 30+ sunscreen to sun-exposed areas.   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 - Observe - Call for any changes  Epidermal inclusion cyst Right Upper Back  Benign-appearing. Exam most consistent with an epidermal inclusion cyst. Discussed that a cyst is a benign growth that can grow over time and sometimes get irritated or inflamed. Recommend observation if it is not bothersome. Discussed option of surgical excision to remove it if it is growing, symptomatic, or other changes noted. Please call for new or changing lesions so they can be evaluated.    Nevus Left spinal Lower Back  Benign-appearing. Stable. Observation.  Call clinic for new or changing moles.  Recommend daily use of broad spectrum spf 30+ sunscreen to sun-exposed areas.    Return in about 1 year (around 02/21/2022) for TBSE.  IJamesetta Orleans, CMA, am acting as scribe for Brendolyn Patty, MD . Documentation: I have reviewed the above documentation for accuracy and completeness, and I agree with the above.  Brendolyn Patty MD

## 2021-05-13 IMAGING — MG DIGITAL SCREENING BILAT W/ TOMO W/ CAD
6 of 10 series · 6 of 30 positions shown · non-contrast
Comparison: Previous exam(s).

CLINICAL DATA: Screening.

EXAM:
DIGITAL SCREENING BILATERAL MAMMOGRAM WITH TOMO AND CAD

[L MLO synth-2D]
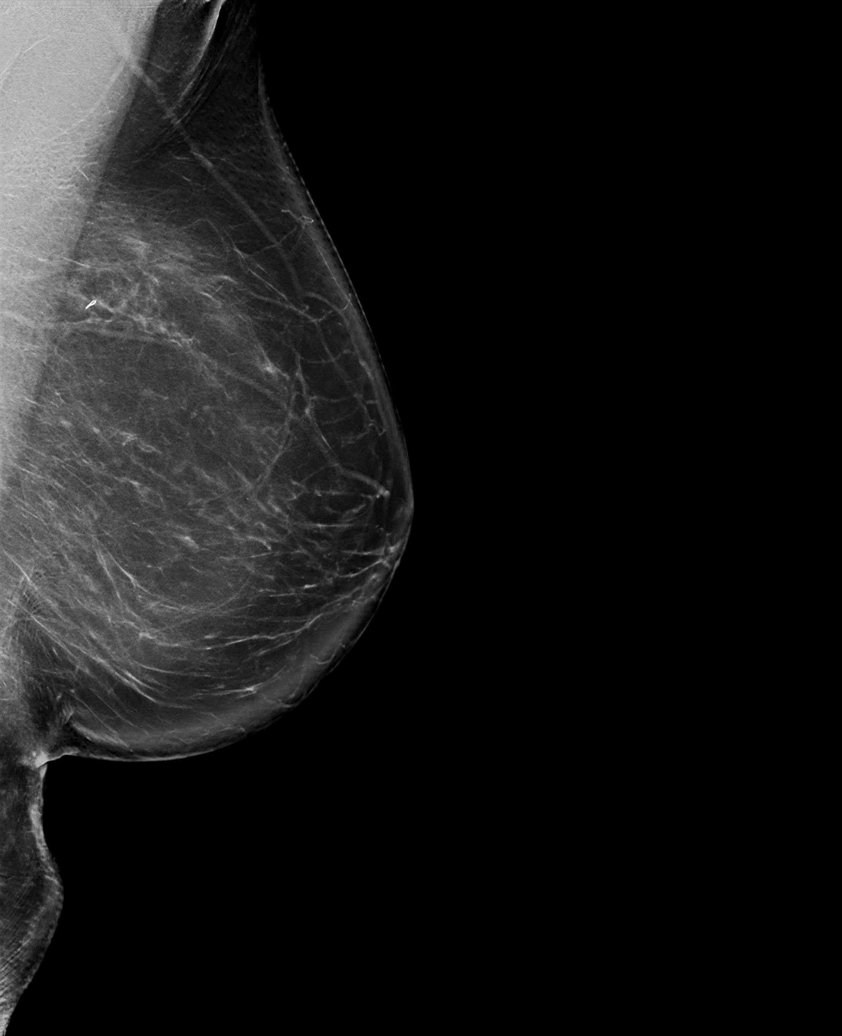

[R MLO synth-2D]
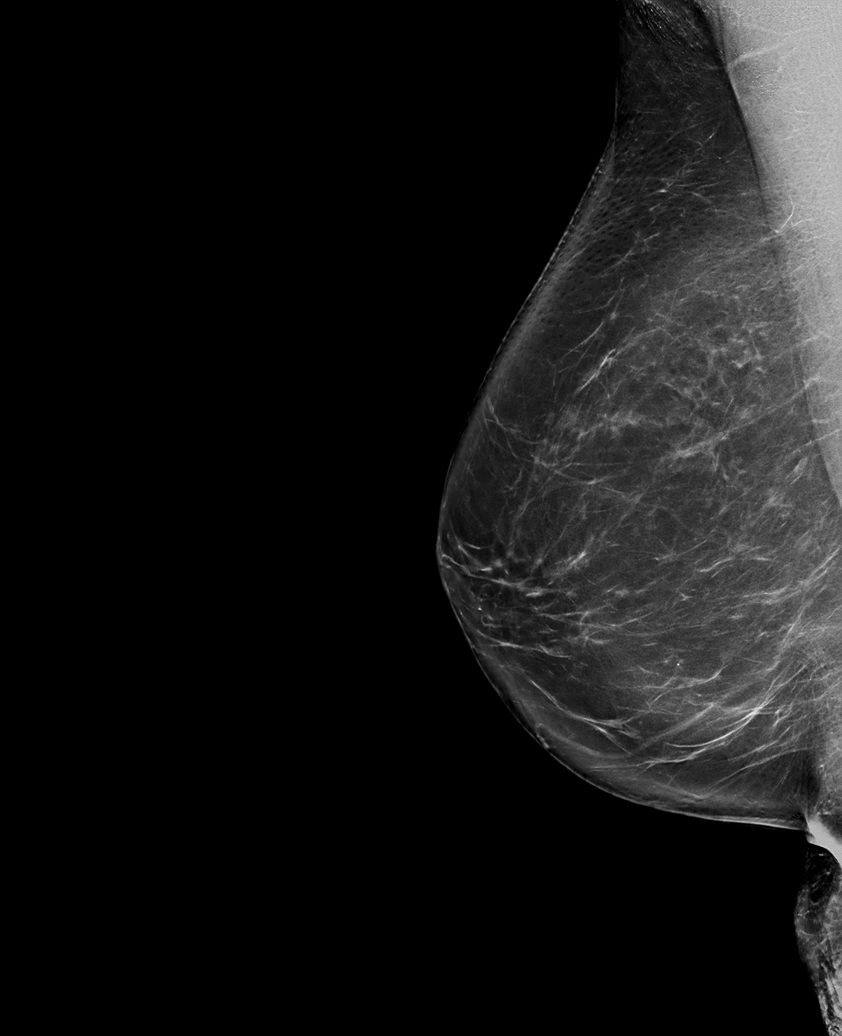

[L CC synth-2D]
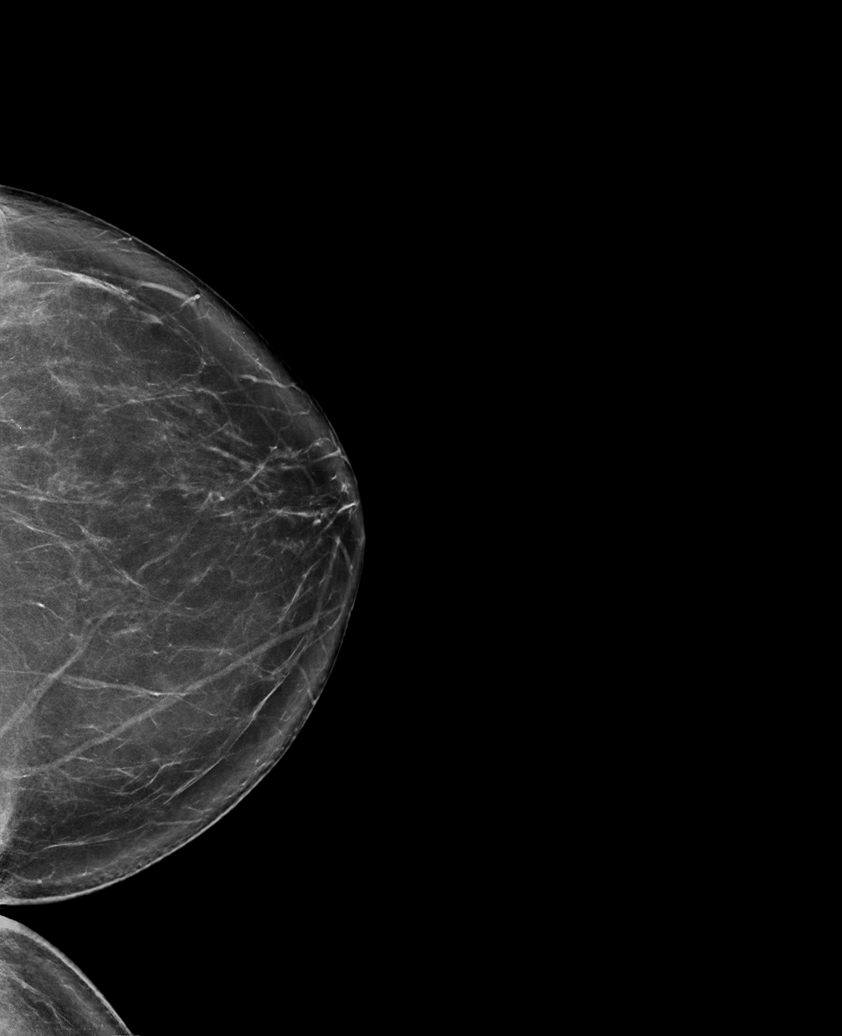

[L XCCL synth-2D]
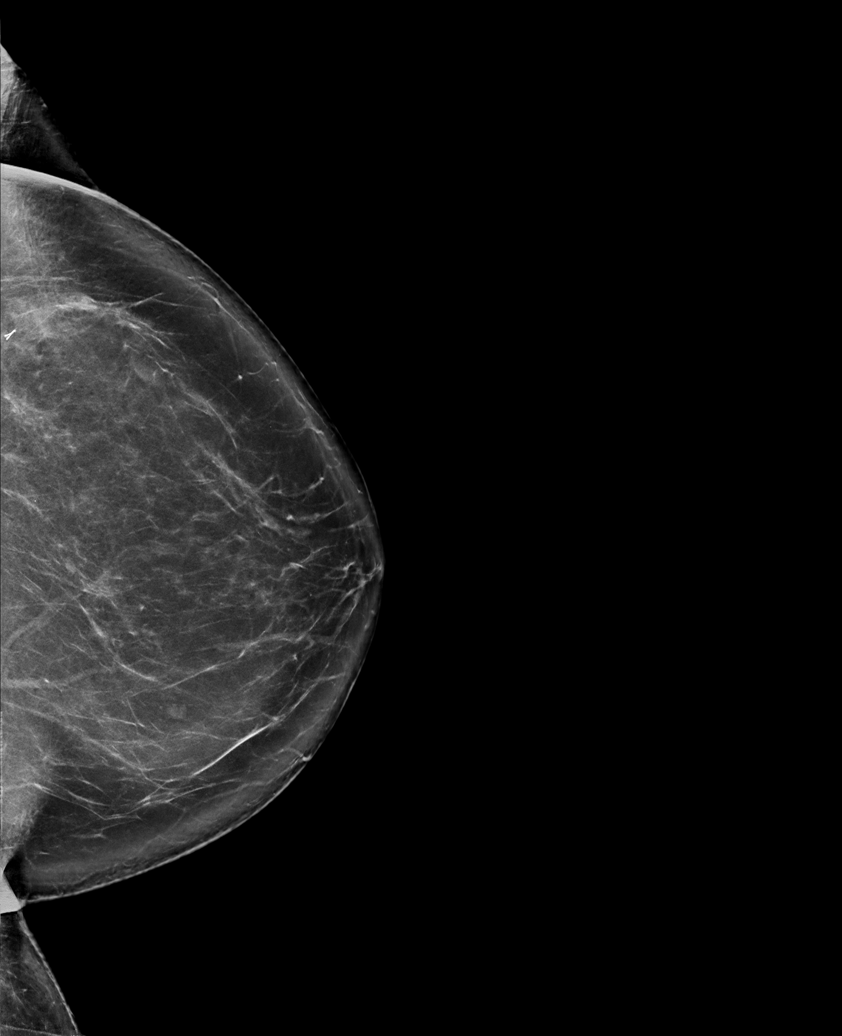

[R CC synth-2D]
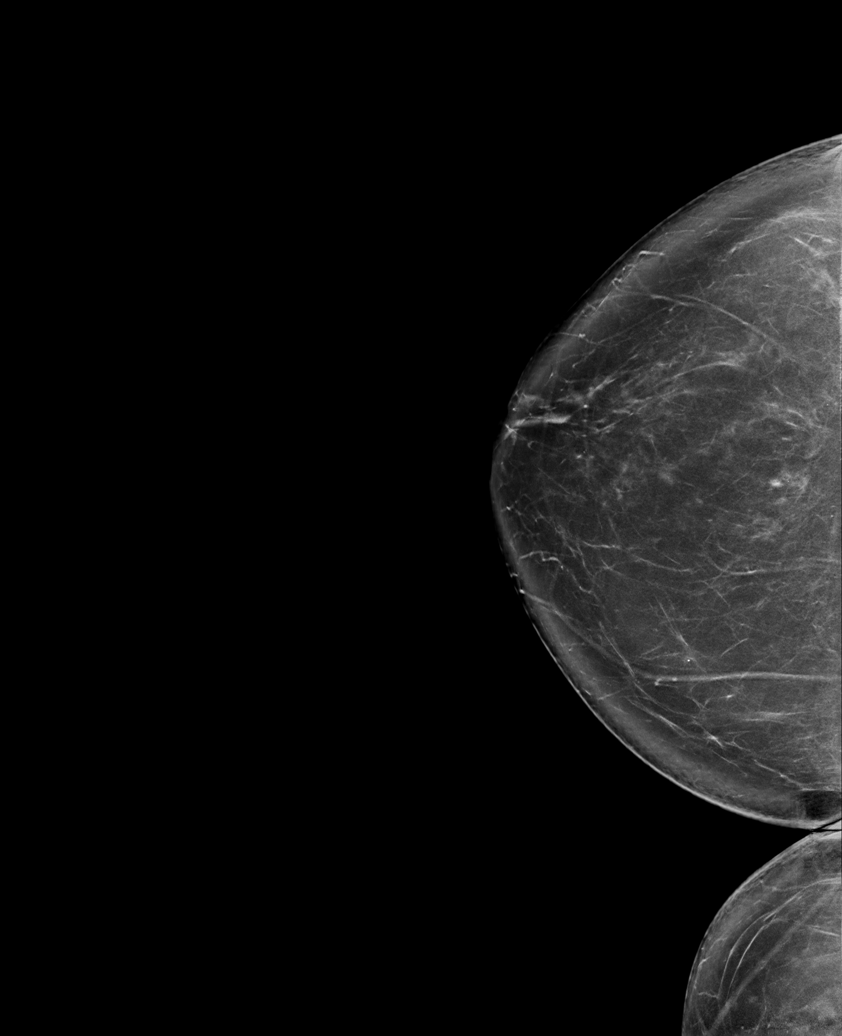

[R MLO tomo · tomo slice 45/90.0]
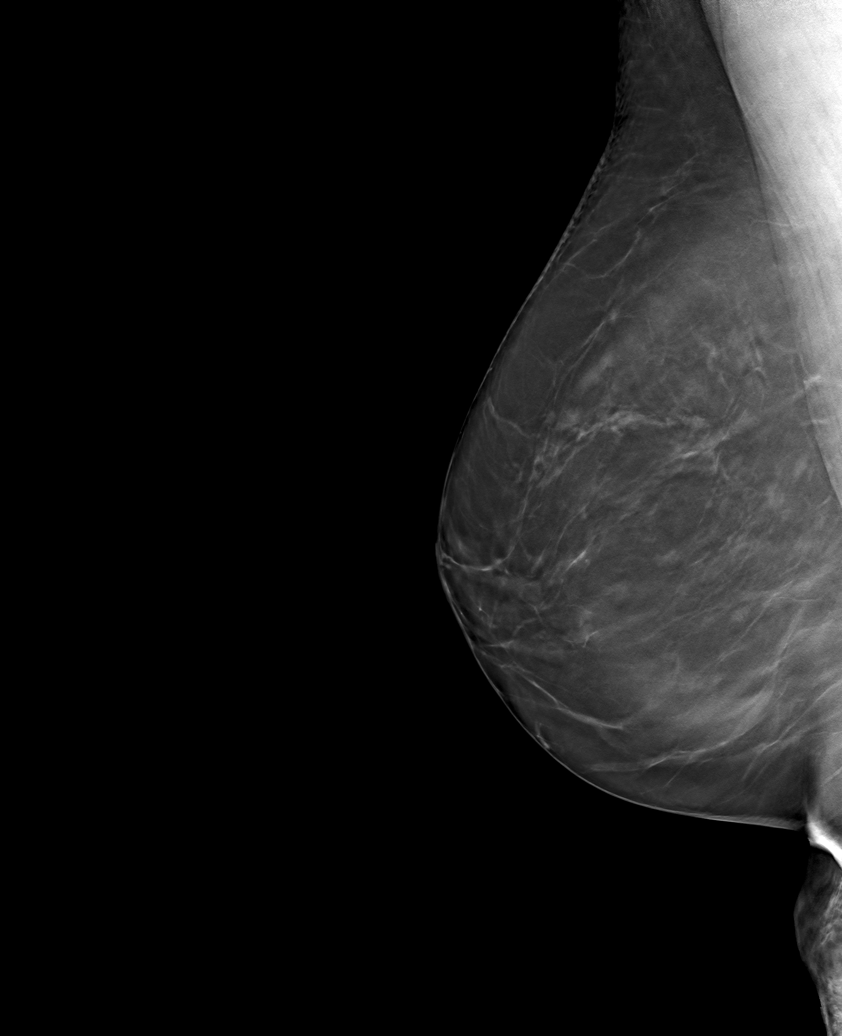

[6 of 30 positions shown; findings below may reference images not displayed]

ACR Breast Density Category b: There are scattered areas of
fibroglandular density.
FINDINGS: There are no findings suspicious for malignancy. Images were
processed with CAD.
IMPRESSION: No mammographic evidence of malignancy. A result letter of this
screening mammogram will be mailed directly to the patient.

RECOMMENDATION:
Screening mammogram in one year. (Code:[id number])

BI-RADS CATEGORY  1: Negative.

## 2021-05-19 ENCOUNTER — Encounter: Payer: Self-pay | Admitting: Urology

## 2021-05-19 ENCOUNTER — Ambulatory Visit (INDEPENDENT_AMBULATORY_CARE_PROVIDER_SITE_OTHER): Payer: BC Managed Care – PPO | Admitting: Urology

## 2021-05-19 VITALS — BP 147/91 | HR 105 | Ht 66.0 in | Wt 154.0 lb

## 2021-05-19 DIAGNOSIS — Z87440 Personal history of urinary (tract) infections: Secondary | ICD-10-CM

## 2021-05-19 DIAGNOSIS — R3129 Other microscopic hematuria: Secondary | ICD-10-CM

## 2021-05-19 DIAGNOSIS — R8281 Pyuria: Secondary | ICD-10-CM | POA: Diagnosis not present

## 2021-05-19 DIAGNOSIS — N390 Urinary tract infection, site not specified: Secondary | ICD-10-CM

## 2021-05-19 LAB — URINALYSIS, COMPLETE
Bilirubin, UA: NEGATIVE
Glucose, UA: NEGATIVE
Ketones, UA: NEGATIVE
Nitrite, UA: NEGATIVE
RBC, UA: NEGATIVE
Specific Gravity, UA: 1.02 (ref 1.005–1.030)
Urobilinogen, Ur: 0.2 mg/dL (ref 0.2–1.0)
pH, UA: 6.5 (ref 5.0–7.5)

## 2021-05-19 LAB — MICROSCOPIC EXAMINATION: Epithelial Cells (non renal): >10 /hpf — AB (ref 0–10)

## 2021-05-19 NOTE — Progress Notes (Signed)
? ?05/19/2021 ?9:31 AM  ? ?[place] ?1960/12/10 ?[id number] ? ?Referring provider: Maryland Pink, MD ?[place] [facility] ?[place],  [place] [address] ? ?Chief Complaint  ?Patient presents with  ? Recurrent UTI  ? ? ?HPI: ?Rachael Carr is a 61 y.o. female referred for evaluation of recurrent UTI ? ? ?Positive urine culture 04/10/21 for E. coli; 04/24/2021-E. Coli ?UA 05/03/2021 with 10-50 WBC/10-50 RBC; urine culture 4/26 negative ?Symptoms included frequency, urgency, suprapubic pressure and dysuria ?Symptoms resolved initially then recurred necessitating a second round of antibiotics. ?On 4/26 she had intense urgency but urine culture was negative ?Presently asymptomatic ? ?PMH: ?Past Medical History:  ?Diagnosis Date  ? Abnormal mammogram 11/2016  ? [place] LT breast  ? Allergy   ? Depression   ? Dysplasia of cervix, low grade (CIN 1) 2009  ? Elevated LFTs   ? Insomnia   ? ? ?Surgical History: ?Past Surgical History:  ?Procedure Laterality Date  ? BREAST BIOPSY Left 12/10/2016  ? [place] bx FIBROCYSTIC CHANGE, PASH  ? CHOLECYSTECTOMY  1989  ? COLONOSCOPY  2013  ? polyp; repeat in 5 yrs.  ? COLONOSCOPY  2019  ? DILATATION & CURETTAGE/HYSTEROSCOPY WITH MYOSURE N/A 09/27/2015  ? Procedure: DILATATION & CURETTAGE/HYSTEROSCOPY WITH MYOSURE;  Surgeon: Will Bonnet, MD;  Location: [facility] ORS;  Service: Gynecology;  Laterality: N/A;  ? FOOT SURGERY Left 2006  ? TUBAL LIGATION    ? [place]  ? ? ?Home Medications:  ?Allergies as of 05/19/2021   ? ?   Reactions  ? Neosporin Original [bacitracin-neomycin-polymyxin]   ? Codeine Palpitations  ? ?  ? ?  ?Medication List  ?  ? ?  ? Accurate as of May 19, 2021  9:31 AM. If you have any questions, ask your nurse or doctor.  ?  ?  ? ?  ? ?albuterol 108 (90 Base) MCG/ACT inhaler ?Commonly known as: VENTOLIN HFA ?Inhale 2 puffs into the lungs every 6 (six) hours as needed for wheezing or shortness of breath. ?  ?cetirizine 10 MG tablet ?Commonly known as:  ZYRTEC ?Take 10 mg by mouth daily. ?  ?Cinnamon 500 MG capsule ?Take 1,000 mg by mouth 2 (two) times daily. ?  ?fluticasone 50 MCG/ACT nasal spray ?Commonly known as: FLONASE ?Place into both nostrils daily. ?  ?PROBIOTIC-10 PO ?Take 1 tablet by mouth daily. ?  ?Turmeric 500 MG Tabs ?Take 1 tablet by mouth daily. ?  ?VITAMIN C PO ?Take by mouth. ?  ?Vitamin D3 1.25 MG (50000 UT) Caps ?Take 1 capsule by mouth daily. ?  ? ?  ? ? ?Allergies:  ?Allergies  ?Allergen Reactions  ? Neosporin Original [Bacitracin-Neomycin-Polymyxin]   ? Codeine Palpitations  ? ? ?Family History: ?Family History  ?Problem Relation Age of Onset  ? Congestive Heart Failure Mother   ? Diabetes Mother   ?     DM Type 2  ? Pancreatic cancer Father 12  ? Hypertension Father   ? Other Sister   ?     elevated LFTs  ? Other Sister   ?     elevated LFTs  ? Bone cancer Maternal Uncle 63  ? Throat cancer Maternal Uncle 65  ? Stomach cancer Maternal Grandmother   ? Breast cancer Neg Hx   ? ? ?Social History:  reports that she has quit smoking. Her smoking use included cigarettes. She has never used smokeless tobacco. She reports current alcohol use. She reports that she  does not use drugs. ? ? ?Physical Exam: ?BP (!) 147/91   Pulse (!) 105   Ht '5\' 6"'$  (1.676 m)   Wt 154 lb (69.9 kg)   BMI 24.86 kg/m?   ?Constitutional:  Alert and oriented, No acute distress. ?HEENT: [place] AT, moist mucus membranes.  Trachea midline ?Respiratory: Normal respiratory effort, no increased work of breathing. ?Psychiatric: Normal mood and affect. ? ?Laboratory Data: ? ?Urinalysis ?Dipstick 1+ protein/1+ leukocytes ?Microscopy 6-10 WBC/>10 epis ? ? ?Assessment & Plan:   ? ?1. Recurrent UTI ? Possible etiologies of recurrent infection include periurethral tissue atrophy in postmenopausal woman, constipation, sexual activity, incomplete emptying, anatomic abnormalities, and even genetic predisposition.  Finally, we discussed the role of perineal hygiene, timed voiding, adequate  hydration, topical vaginal estrogen, cranberry prophylaxis ?She had pyuria/microhematuria on last UA with a negative culture and will schedule noncontrast CT abdomen/pelvis  ? ? ?Abbie Sons, MD ? ?[facility] ?[address], [address] ?[place], [place] [address] ?[phone] ?

## 2021-05-19 NOTE — Patient Instructions (Signed)
Take over the counter D-Mannose and Cranberry tablets daily 

## 2021-05-31 ENCOUNTER — Ambulatory Visit
Admission: RE | Admit: 2021-05-31 | Discharge: 2021-05-31 | Disposition: A | Discharge status: Home / Self Care | Payer: BC Managed Care – PPO | Source: Ambulatory Visit | Attending: Urology | Admitting: Urology

## 2021-05-31 DIAGNOSIS — R3129 Other microscopic hematuria: Secondary | ICD-10-CM | POA: Diagnosis present

## 2021-05-31 DIAGNOSIS — R8281 Pyuria: Secondary | ICD-10-CM | POA: Insufficient documentation

## 2021-06-01 ENCOUNTER — Telehealth: Payer: Self-pay | Admitting: *Deleted

## 2021-06-01 NOTE — Telephone Encounter (Signed)
Notified patient as instructed, patient pleased. Discussed follow-up appointments, patient agrees  

## 2021-06-01 NOTE — Telephone Encounter (Signed)
-----   Message from Abbie Sons, MD sent at 06/01/2021  4:00 PM EDT ----- CT shows a small stone in the bottom portion of the left kidney which would be an unlikely source of infections and urine findings.  Recommend scheduling cystoscopy for lower tract evaluation.

## 2021-06-14 ENCOUNTER — Ambulatory Visit (INDEPENDENT_AMBULATORY_CARE_PROVIDER_SITE_OTHER): Payer: BC Managed Care – PPO | Admitting: Urology

## 2021-06-14 ENCOUNTER — Encounter: Payer: Self-pay | Admitting: Urology

## 2021-06-14 VITALS — BP 156/99 | HR 101 | Ht 66.0 in | Wt 156.0 lb

## 2021-06-14 DIAGNOSIS — R8281 Pyuria: Secondary | ICD-10-CM | POA: Diagnosis not present

## 2021-06-14 DIAGNOSIS — N390 Urinary tract infection, site not specified: Secondary | ICD-10-CM

## 2021-06-14 LAB — URINALYSIS, COMPLETE
Bilirubin, UA: NEGATIVE
Glucose, UA: NEGATIVE
Ketones, UA: NEGATIVE
Nitrite, UA: NEGATIVE
Protein,UA: NEGATIVE
Specific Gravity, UA: 1.01 (ref 1.005–1.030)
Urobilinogen, Ur: 0.2 mg/dL (ref 0.2–1.0)
pH, UA: 7 (ref 5.0–7.5)

## 2021-06-14 LAB — MICROSCOPIC EXAMINATION
Epithelial Cells (non renal): >10 /hpf — AB (ref 0–10)
WBC, UA: >30 /hpf — AB (ref 0–5)

## 2021-06-14 MED ORDER — SULFAMETHOXAZOLE-TRIMETHOPRIM 800-160 MG PO TABS
1.0000 | ORAL_TABLET | Freq: Once | ORAL | Status: AC
  Administered 2021-06-14: 1 via ORAL

## 2021-06-14 NOTE — Progress Notes (Signed)
   06/14/21  CC:  Chief Complaint  Patient presents with   Cysto    HPI: Refer to my previous note 05/19/2021.  CT showed a nonobstructing 7 mm left lower pole calculus; low-density renal lesions statistically consistent with cysts.  UA today with >30 WBC though there were >10 epithelial cells  Blood pressure (!) 156/99, pulse (!) 101, height '5\' 6"'$  (1.676 m), weight 156 lb (70.8 kg). NED. A&Ox3.   No respiratory distress   Abd soft, NT, ND Atrophic external genitalia with patent urethral meatus  Cystoscopy Procedure Note  Patient identification was confirmed, informed consent was obtained, and patient was prepped using Betadine solution.  Lidocaine jelly was administered per urethral meatus.    Procedure: - Flexible cystoscope introduced, without any difficulty.   - Thorough search of the bladder revealed:    normal urethral meatus    normal urothelium    no stones    no ulcers     no tumors    no urethral polyps    no trabeculation  - Ureteral orifices were normal in position and appearance.  Post-Procedure: - Patient tolerated the procedure well  Assessment/ Plan: No mucosal abnormalities on cystoscopy Pyuria with >10 epis on voided urine today.  Catheterized urine sent for culture Follow-up 6 months with KUB Consider trial of low-dose antibiotic prophylaxis for 6-8 weeks for culture documented infections   Abbie Sons, MD

## 2021-06-16 ENCOUNTER — Telehealth: Payer: Self-pay | Admitting: Urology

## 2021-06-16 NOTE — Telephone Encounter (Signed)
Patient was asking about her results from her culture . Results not in yet .We will call with results.

## 2021-06-16 NOTE — Telephone Encounter (Signed)
Pt got test results back on [facility] and wants someone to call her to go over results.

## 2021-06-19 ENCOUNTER — Encounter: Payer: Self-pay | Admitting: Urology

## 2021-06-21 ENCOUNTER — Telehealth: Payer: Self-pay | Admitting: Urology

## 2021-06-21 NOTE — Telephone Encounter (Signed)
Patient is calling stating that she has seen her culture results and wants to know if she is going to be given an abx? She said she is going out of town and needs this before she leaves.  Thanks, Sharyn Lull

## 2021-06-22 LAB — CULTURE, URINE COMPREHENSIVE

## 2021-06-22 MED ORDER — SULFAMETHOXAZOLE-TRIMETHOPRIM 800-160 MG PO TABS: 1.0000 | ORAL_TABLET | Freq: Two times a day (BID) | ORAL | 0 refills | Status: AC

## 2021-06-22 NOTE — Telephone Encounter (Signed)
Patient notified and will get RX.

## 2021-06-22 NOTE — Telephone Encounter (Signed)
Pt called is leaving @ 1:30 today and would like antibiotic to treat her symptoms. Her pharmacy is [facility].

## 2021-06-22 NOTE — Telephone Encounter (Signed)
Rx sent to pharmacy   

## 2021-07-05 ENCOUNTER — Telehealth: Payer: Self-pay | Admitting: Urology

## 2021-07-05 NOTE — Telephone Encounter (Signed)
PT [facility] requesting a CMA call her back.  She didn't say what it was about.

## 2021-07-05 NOTE — Telephone Encounter (Signed)
I called pt back to find out exactly what she wanted and she finished her abx and wants to know if she needs to come in and leave another urine sample.

## 2021-07-05 NOTE — Telephone Encounter (Signed)
Talk with patient and advised her that she don't need to bring in a sample . Call us if she needs [place].

## 2021-07-28 ENCOUNTER — Telehealth: Payer: Self-pay

## 2021-07-28 NOTE — Telephone Encounter (Signed)
Incoming call on triage line from pt in regards to a UTI. Pt states she was having symptoms and was seen today at her PCP who did a urinalysis. Pt was treated w/ Septra for 7 days. [place] now [place]. Pt remembers Dr Bernardo Heater mentioning something ab a long term antibiotic and PCP mentioned this to pt today as well, that she could see that in his note. Pt would like to know if this is still a possibility and how to proceed.

## 2021-07-30 NOTE — Telephone Encounter (Signed)
Can send in Rx trimethoprim 100 mg daily #30/1 refill to start after she completes her current antibiotic dose.  Will need to find out if she wants Rx sent to her local pharmacy or [place]  Schedule PA follow-up 2 months for recheck

## 2021-07-31 MED ORDER — TRIMETHOPRIM 100 MG PO TABS: 100.0000 mg | ORAL_TABLET | Freq: Every day | ORAL | 1 refills | Status: DC

## 2021-07-31 NOTE — Addendum Note (Signed)
Addended by: Chrystie Nose on: 07/31/2021 04:21 PM   Modules accepted: Orders

## 2021-07-31 NOTE — Telephone Encounter (Addendum)
Notified patient as instructed, patient pleased. Discussed follow-up appointments, patient agrees . Sent to [facility] per patient

## 2021-09-29 ENCOUNTER — Telehealth: Payer: Self-pay | Admitting: Urology

## 2021-09-29 NOTE — Telephone Encounter (Signed)
Pt called wanted to know if the 9/29 appt is ok because she is just finishing up her medication.  Please advise. Thank you.

## 2021-09-29 NOTE — Telephone Encounter (Signed)
Spoke w pt, advised her it is ok to keep 10/06/21 appt. She expressed understanding.

## 2021-10-03 ENCOUNTER — Ambulatory Visit: Payer: BC Managed Care – PPO | Admitting: Urology

## 2021-10-03 ENCOUNTER — Other Ambulatory Visit: Payer: Self-pay | Admitting: Obstetrics and Gynecology

## 2021-10-03 DIAGNOSIS — Z1231 Encounter for screening mammogram for malignant neoplasm of breast: Secondary | ICD-10-CM

## 2021-10-05 NOTE — Progress Notes (Signed)
10/06/2021 10:28 AM   Rachael Carr [birth year] [id number]  Referring provider: Maryland Pink, MD [address] [place],  [place] [address]  Urological history: 1. rUTI's -Good duty factors of age, vaginal atrophy, -CT (05/2021) -7 mm left lower pole stone -cysto (06/2021) - NED -Documented urine cultures over the last year  07/28/2021 E.coli   06/14/2021 E.coli 3,000 colonies  05/03/2021 < 10,000 colonies w/ micro heme  04/24/2021 E.coli   04/10/2021 E.coli    Chief Complaint  Patient presents with   Recurrent UTI   Nephrolithiasis    HPI: Rachael Carr is a 61 y.o. female who presents today for recheck after of course of suppressive trimethoprim.  She has had no breakthrough infections, since she has started the trimethoprim.    Today, she is asymptomatic.  Patient denies any modifying or aggravating factors.  Patient denies any gross hematuria, dysuria or suprapubic/flank pain.  Patient denies any fevers, chills, nausea or vomiting.     PMH: Past Medical History:  Diagnosis Date   Abnormal mammogram 11/2016   [facility] LT breast   Allergy    Depression    Dysplasia of cervix, low grade (CIN 1) 2009   Elevated LFTs    Insomnia     Surgical History: Past Surgical History:  Procedure Laterality Date   BREAST BIOPSY Left 12/10/2016   [place] bx FIBROCYSTIC CHANGE, [place]   CHOLECYSTECTOMY  1989   COLONOSCOPY  2013   polyp; repeat in 5 yrs.   COLONOSCOPY  2019   DILATATION & CURETTAGE/HYSTEROSCOPY WITH MYOSURE N/A 09/27/2015   Procedure: DILATATION & CURETTAGE/HYSTEROSCOPY WITH MYOSURE;  Surgeon: Will Bonnet, MD;  Location: [facility] ORS;  Service: Gynecology;  Laterality: N/A;   FOOT SURGERY Left 2006   TUBAL LIGATION     VAGINA SURGERY  1989    Home Medications:  Allergies as of 10/06/2021       Reactions   Neosporin Original [bacitracin-neomycin-polymyxin]    Codeine Palpitations        Medication List        Accurate as of  October 06, 2021 10:28 AM. If you have any questions, ask your nurse or doctor.          albuterol 108 (90 Base) MCG/ACT inhaler Commonly known as: VENTOLIN HFA Inhale 2 puffs into the lungs every 6 (six) hours as needed for wheezing or shortness of breath.   cetirizine 10 MG tablet Commonly known as: ZYRTEC Take 10 mg by mouth daily.   Cinnamon 500 MG capsule Take 1,000 mg by mouth 2 (two) times daily.   fluticasone 50 MCG/ACT nasal spray Commonly known as: FLONASE Place into both nostrils daily.   PROBIOTIC-10 PO Take 1 tablet by mouth daily.   trimethoprim 100 MG tablet Commonly known as: TRIMPEX Take 1 tablet (100 mg total) by mouth daily. What changed: Another medication with the same name was added. Make sure you understand how and when to take each.   trimethoprim 100 MG tablet Commonly known as: TRIMPEX Take 1 tablet (100 mg total) by mouth daily. What changed: You were already taking a medication with the same name, and this prescription was added. Make sure you understand how and when to take each.   Turmeric 500 MG Tabs Take 1 tablet by mouth daily.   VITAMIN C PO Take by mouth.   Vitamin D3 1.25 MG (50000 UT) Caps Take 1 capsule by mouth daily.  Allergies:  Allergies  Allergen Reactions   Neosporin Original [Bacitracin-Neomycin-Polymyxin]    Codeine Palpitations    Family History: Family History  Problem Relation Age of Onset   Congestive Heart Failure Mother    Diabetes Mother        DM Type 2   Pancreatic cancer Father 36   Hypertension Father    Other Sister        elevated LFTs   Other Sister        elevated LFTs   Bone cancer Maternal Uncle 63   Throat cancer Maternal Uncle 65   Stomach cancer Maternal Grandmother    Breast cancer Neg Hx     Social History:  reports that she has quit smoking. Her smoking use included cigarettes. She has never used smokeless tobacco. She reports current alcohol use. She reports that she  does not use drugs.  ROS: Pertinent ROS in HPI  Physical Exam: BP (!) 151/93   Pulse 99   Ht '5\' 6"'$  (1.676 m)   Wt 162 lb (73.5 kg)   BMI 26.15 kg/m   Constitutional:  Well nourished. Alert and oriented, No acute distress. HEENT: [place] AT, moist mucus membranes.  Trachea midline Cardiovascular: No clubbing, cyanosis, or edema. Respiratory: Normal respiratory effort, no increased work of breathing. Skin: No rashes, bruises or suspicious lesions. Neurologic: Grossly intact, no focal deficits, moving all 4 extremities. Psychiatric: Normal mood and affect.  Laboratory Data: N/A    Pertinent Imaging: N/A  Assessment & Plan:    1. rUTI's -She has been asymptomatic since she has been on the trimethoprim.  She takes her last trimethoprim today.  I have extended the prescription so she can take trimethoprim through the weekend and then she will discontinue it on Monday, therefore if she should develop symptoms of urinary tract infection our office is open and we can perform a urinalysis microscopy and send it for culture to be sure she is treated adequately  2. Nephrolithiasis -RTC in 12/2021 for KUB  Return for 12/2021 for KUB.  These notes generated with voice recognition software. I apologize for typographical errors.  [place], [place] [address]  [place],  [address] [phone]

## 2021-10-06 ENCOUNTER — Other Ambulatory Visit: Payer: Self-pay

## 2021-10-06 ENCOUNTER — Ambulatory Visit (INDEPENDENT_AMBULATORY_CARE_PROVIDER_SITE_OTHER): Payer: BC Managed Care – PPO | Admitting: Urology

## 2021-10-06 ENCOUNTER — Encounter: Payer: Self-pay | Admitting: Urology

## 2021-10-06 VITALS — BP 151/93 | HR 99 | Ht 66.0 in | Wt 162.0 lb

## 2021-10-06 DIAGNOSIS — N390 Urinary tract infection, site not specified: Secondary | ICD-10-CM

## 2021-10-06 DIAGNOSIS — Z87440 Personal history of urinary (tract) infections: Secondary | ICD-10-CM | POA: Diagnosis not present

## 2021-10-06 DIAGNOSIS — N200 Calculus of kidney: Secondary | ICD-10-CM

## 2021-10-06 MED ORDER — TRIMETHOPRIM 100 MG PO TABS: 100.0000 mg | ORAL_TABLET | Freq: Every day | ORAL | 0 refills | Status: DC

## 2021-10-16 ENCOUNTER — Ambulatory Visit (INDEPENDENT_AMBULATORY_CARE_PROVIDER_SITE_OTHER): Payer: BC Managed Care – PPO | Admitting: Dermatology

## 2021-10-16 DIAGNOSIS — L820 Inflamed seborrheic keratosis: Secondary | ICD-10-CM

## 2021-10-16 DIAGNOSIS — L720 Epidermal cyst: Secondary | ICD-10-CM

## 2021-10-16 DIAGNOSIS — D485 Neoplasm of uncertain behavior of skin: Secondary | ICD-10-CM

## 2021-10-16 NOTE — Progress Notes (Signed)
   Follow-Up Visit   Subjective  Rachael Carr is a 61 y.o. female who presents for the following: Cyst (Right upper back. Patient presents for excision. ). She also has a spot on her right wrist that gets rubbed and irritated by her watch band.    The following portions of the chart were reviewed this encounter and updated as appropriate:       Review of Systems:  No other skin or systemic complaints except as noted in HPI or Assessment and Plan.  Objective  Well appearing patient in no apparent distress; mood and affect are within normal limits.  A focused examination was performed including back. Relevant physical exam findings are noted in the Assessment and Plan.  Right Upper Back Firm subcutaneous nodule 1.1cm  Right Wrist Erythematous stuck-on, waxy papule    Assessment & Plan  Neoplasm of uncertain behavior of skin Right Upper Back  Skin excision  Lesion length (cm):  1.1 Lesion width (cm):  1.1 Margin per side (cm):  0.1 Total excision diameter (cm):  1.3 Informed consent: discussed and consent obtained   Timeout: patient name, date of birth, surgical site, and procedure verified   Procedure prep:  Patient was prepped and draped in usual sterile fashion Prep type:  Povidone-iodine Anesthesia: the lesion was anesthetized in a standard fashion   Local anesthetic: Total 12cc - 6cc lido w/epi, 6cc bupivicaine. Instrument used comment:  #15c blade Hemostasis achieved with: pressure and electrodesiccation   Outcome: patient tolerated procedure well with no complications   Post-procedure details: sterile dressing applied and wound care instructions given   Dressing type: petrolatum and pressure dressing    Skin repair Complexity:  Intermediate Final length (cm):  1.3 Undermining: edges could be approximated without difficulty and edges undermined   Subcutaneous layers (deep stitches):  Suture size:  3-0 Suture type: Vicryl (polyglactin 910)   Stitches:   Buried vertical mattress Fine/surface layer approximation (top stitches):  Suture size:  4-0 Suture type: nylon   Stitches: simple interrupted   Suture removal (days):  7 Hemostasis achieved with: suture Outcome: patient tolerated procedure well with no complications   Post-procedure details: sterile dressing applied and wound care instructions given   Dressing type: pressure dressing (mupirocin)    Specimen 1 - Surgical pathology Differential Diagnosis: Cyst vs other Check Margins: No  Inflamed seborrheic keratosis Right Wrist  Symptomatic, irritating, patient would like treated.  Destruction of lesion - Right Wrist  Destruction method: cryotherapy   Informed consent: discussed and consent obtained   Lesion destroyed using liquid nitrogen: Yes   Region frozen until ice ball extended beyond lesion: Yes   Outcome: patient tolerated procedure well with no complications   Post-procedure details: wound care instructions given   Additional details:  Prior to procedure, discussed risks of blister formation, small wound, skin dyspigmentation, or rare scar following cryotherapy. Recommend Vaseline ointment to treated areas while healing.    Return in about 1 week (around 10/23/2021) for suture removal.  I, Jamesetta Orleans, CMA, am acting as scribe for Brendolyn Patty, MD .  Documentation: I have reviewed the above documentation for accuracy and completeness, and I agree with the above.  Brendolyn Patty MD

## 2021-10-16 NOTE — Patient Instructions (Signed)
Wound Care Instructions for After Surgery  On the day following your surgery, you should begin doing daily dressing changes until your sutures are removed: Remove the bandage. Cleanse the wound gently with soap and water.  Make sure you then dry the skin surrounding the wound completely or the tape will not stick to the skin. Do not use cotton balls on the wound. After the wound is clean and dry, apply the ointment (either prescription antibiotic prescribed by your doctor or plain Vaseline if nothing was prescribed) gently with a Q-tip. If you are using a bandaid to cover: Apply a bandaid large enough to cover the entire wound. If you do not have a bandaid large enough to cover the wound OR if you are sensitive to bandaid adhesive: Cut a non-stick pad (such as Telfa) to fit the size of the wound.  Cover the wound with the non-stick pad. If the wound is draining, you may want to add a small amount of gauze on top of the non-stick pad for a little added compression to the area. Use tape to seal the area completely.  For the next 1-2 weeks: Be sure to keep the wound moist with ointment 24/7 to ensure best healing. If you are unable to cover the wound with a bandage to hold the ointment in place, you may need to reapply the ointment several times a day. Do not bend over or lift heavy items to reduce the chance of elevated blood pressure to the wound. Do not participate in particularly strenuous activities.  Below is a list of dressing supplies you might need.  Cotton-tipped applicators - Q-tips Gauze pads (2x2 and/or 4x4) - All-Purpose Sponges New and clean tube of petroleum jelly (Vaseline) OR prescription antibiotic ointment if prescribed Either a bandaid large enough to cover the entire wound OR non-stick dressing material (Telfa) and Tape (Paper or Hypafix)  FOR ADULT SURGERY PATIENTS: If you need something for pain relief, you may take 1 extra strength Tylenol (acetaminophen) and 2  ibuprofen (200 mg) together every 4 hours as needed. (Do not take these medications if you are allergic to them or if you know you cannot take them for any other reason). Typically you may only need pain medication for 1-3 days.   Comments on the Post-Operative Period Slight swelling and redness often appear around the wound. This is normal and will disappear within several days following the surgery. The healing wound will drain a brownish-red-yellow discharge during healing. This is a normal phase of wound healing. As the wound begins to heal, the drainage may increase in amount. Again, this drainage is normal. Notify us if the drainage becomes persistently bloody, excessively swollen, or intensely painful or develops a foul odor or red streaks.  The healing wound will also typically be itchy. This is normal. If you have severe or persistent pain, Notify us if the discomfort is severe or persistent. Avoid alcoholic beverages when taking pain medicine.  In Case of Wound Hemorrhage A wound hemorrhage is when the bandage suddenly becomes soaked with bright red blood and flows profusely. If this happens, sit down or lie down with your head elevated. If the wound has a dressing on it, do not remove the dressing. Apply pressure to the existing gauze. If the wound is not covered, use a gauze pad to apply pressure and continue applying the pressure for 20 minutes without peeking. DO NOT COVER THE WOUND WITH A LARGE TOWEL OR WASH CLOTH. Release your hand from the   wound site but do not remove the dressing. If the bleeding has stopped, gently clean around the wound. Leave the dressing in place for 24 hours if possible. This wait time allows the blood vessels to close off so that you do not spark a new round of bleeding by disrupting the newly clotted blood vessels with an immediate dressing change. If the bleeding does not subside, continue to hold pressure for 40 minutes. If bleeding continues, page your  physician, contact an After Hours clinic or go to the Emergency Room.  Due to recent changes in healthcare laws, you may see results of your pathology and/or laboratory studies on MyChart before the doctors have had a chance to review them. We understand that in some cases there may be results that are confusing or concerning to you. Please understand that not all results are received at the same time and often the doctors may need to interpret multiple results in order to provide you with the best plan of care or course of treatment. Therefore, we ask that you please give us 2 business days to thoroughly review all your results before contacting the office for clarification. Should we see a critical lab result, you will be contacted sooner.   If You Need Anything After Your Visit  If you have any questions or concerns for your doctor, please call our main line at [phone] and press option 4 to reach your doctor's medical assistant. If no one answers, please leave a voicemail as directed and we will return your call as soon as possible. Messages left after 4 pm will be answered the following business day.   You may also send us a message via MyChart. We typically respond to MyChart messages within 1-2 business days.  For prescription refills, please ask your pharmacy to contact our office. Our fax number is [phone].  If you have an urgent issue when the clinic is closed that cannot wait until the next business day, you can page your doctor at the number below.    Please note that while we do our best to be available for urgent issues outside of office hours, we are not available 24/7.   If you have an urgent issue and are unable to reach us, you may choose to seek medical care at your doctor's office, retail clinic, urgent care center, or emergency room.  If you have a medical emergency, please immediately call 911 or go to the emergency department.  Pager Numbers  - Dr. Kowalski:  [phone]  - Dr. Moye: [phone]  - Dr. Stewart: [phone]  In the event of inclement weather, please call our main line at [phone] for an update on the status of any delays or closures.  Dermatology Medication Tips: Please keep the boxes that topical medications come in in order to help keep track of the instructions about where and how to use these. Pharmacies typically print the medication instructions only on the boxes and not directly on the medication tubes.   If your medication is too expensive, please contact our office at [phone] option 4 or send us a message through MyChart.   We are unable to tell what your co-pay for medications will be in advance as this is different depending on your insurance coverage. However, we may be able to find a substitute medication at lower cost or fill out paperwork to get insurance to cover a needed medication.   If a prior authorization is required to get your medication covered by   your insurance company, please allow us 1-2 business days to complete this process.  Drug prices often vary depending on where the prescription is filled and some pharmacies may offer cheaper prices.  The website www.goodrx.com contains coupons for medications through different pharmacies. The prices here do not account for what the cost may be with help from insurance (it may be cheaper with your insurance), but the website can give you the [place] if you did not use any insurance.  - You can print the associated coupon and take it with your prescription to the pharmacy.  - You may also stop by our office during regular business hours and pick up a GoodRx coupon card.  - If you need your prescription sent electronically to a different pharmacy, notify our office through [facility] MyChart or by phone at [phone] option 4.     Si Usted Necesita Algo Despus de Su Visita  Tambin puede enviarnos un mensaje a travs de MyChart. Por lo general  respondemos a los mensajes de MyChart en el transcurso de 1 a 2 das hbiles.  Para renovar recetas, por favor pida a su farmacia que se ponga en contacto con nuestra oficina. Nuestro nmero de fax es el [phone].  Si tiene un asunto urgente cuando la clnica est cerrada y que no puede esperar hasta el siguiente da hbil, puede llamar/localizar a su doctor(a) al nmero que aparece a continuacin.   Por favor, tenga en cuenta que aunque hacemos todo lo posible para estar disponibles para asuntos urgentes fuera del horario de oficina, no estamos disponibles las 24 horas del da, los 7 das de la semana.   Si tiene un problema urgente y no puede comunicarse con nosotros, puede optar por buscar atencin mdica  en el consultorio de su doctor(a), en una clnica privada, en un centro de atencin urgente o en una sala de emergencias.  Si tiene una emergencia mdica, por favor llame inmediatamente al 911 o vaya a la sala de emergencias.  Nmeros de bper  - Dr. Kowalski: [phone]  - Dra. Moye: [phone]  - Dra. Stewart: [phone]  En caso de inclemencias del tiempo, por favor llame a nuestra lnea principal al [phone] para una actualizacin sobre el estado de cualquier retraso o cierre.  Consejos para la medicacin en dermatologa: Por favor, guarde las cajas en las que vienen los medicamentos de uso tpico para ayudarle a seguir las instrucciones sobre dnde y cmo usarlos. Las farmacias generalmente imprimen las instrucciones del medicamento slo en las cajas y no directamente en los tubos del medicamento.   Si su medicamento es muy caro, por favor, pngase en contacto con nuestra oficina llamando al [phone] y presione la opcin 4 o envenos un mensaje a travs de MyChart.   No podemos decirle cul ser su copago por los medicamentos por adelantado ya que esto es diferente dependiendo de la cobertura de su seguro. Sin embargo, es posible que podamos encontrar un  medicamento sustituto a menor costo o llenar un formulario para que el seguro cubra el medicamento que se considera necesario.   Si se requiere una autorizacin previa para que su compaa de seguros cubra su medicamento, por favor permtanos de 1 a 2 das hbiles para completar este proceso.  Los precios de los medicamentos varan con frecuencia dependiendo del lugar de dnde se surte la receta y alguna farmacias pueden ofrecer precios ms baratos.  El sitio web www.goodrx.com tiene cupones para medicamentos de diferentes farmacias. Los precios aqu no tienen   en cuenta lo que podra costar con la ayuda del seguro (puede ser ms barato con su seguro), pero el sitio web puede darle el precio si no utiliz ningn seguro.  - Puede imprimir el cupn correspondiente y llevarlo con su receta a la farmacia.  - Tambin puede pasar por nuestra oficina durante el horario de atencin regular y recoger una tarjeta de cupones de GoodRx.  - Si necesita que su receta se enve electrnicamente a una farmacia diferente, informe a nuestra oficina a travs de MyChart de [facility] o por telfono llamando al [phone] y presione la opcin 4.  

## 2021-10-17 ENCOUNTER — Telehealth: Payer: Self-pay

## 2021-10-17 NOTE — Telephone Encounter (Signed)
Talked to patient and she is doing ok from surgery yesterday.  

## 2021-10-23 ENCOUNTER — Ambulatory Visit: Payer: BC Managed Care – PPO

## 2021-10-24 ENCOUNTER — Ambulatory Visit (INDEPENDENT_AMBULATORY_CARE_PROVIDER_SITE_OTHER): Payer: BC Managed Care – PPO | Admitting: Dermatology

## 2021-10-24 DIAGNOSIS — L720 Epidermal cyst: Secondary | ICD-10-CM

## 2021-10-24 DIAGNOSIS — Z4802 Encounter for removal of sutures: Secondary | ICD-10-CM

## 2021-10-24 NOTE — Patient Instructions (Signed)
Due to recent changes in healthcare laws, you may see results of your pathology and/or laboratory studies on MyChart before the doctors have had a chance to review them. We understand that in some cases there may be results that are confusing or concerning to you. Please understand that not all results are received at the same time and often the doctors may need to interpret multiple results in order to provide you with the best plan of care or course of treatment. Therefore, we ask that you please give us 2 business days to thoroughly review all your results before contacting the office for clarification. Should we see a critical lab result, you will be contacted sooner.   If You Need Anything After Your Visit  If you have any questions or concerns for your doctor, please call our main line at [phone] and press option 4 to reach your doctor's medical assistant. If no one answers, please leave a voicemail as directed and we will return your call as soon as possible. Messages left after 4 pm will be answered the following business day.   You may also send us a message via MyChart. We typically respond to MyChart messages within 1-2 business days.  For prescription refills, please ask your pharmacy to contact our office. Our fax number is [phone].  If you have an urgent issue when the clinic is closed that cannot wait until the next business day, you can page your doctor at the number below.    Please note that while we do our best to be available for urgent issues outside of office hours, we are not available 24/7.   If you have an urgent issue and are unable to reach us, you may choose to seek medical care at your doctor's office, retail clinic, urgent care center, or emergency room.  If you have a medical emergency, please immediately call 911 or go to the emergency department.  Pager Numbers  - Dr. Kowalski: [phone]  - Dr. Moye: [phone]  - Dr. Stewart:  [phone]  In the event of inclement weather, please call our main line at [phone] for an update on the status of any delays or closures.  Dermatology Medication Tips: Please keep the boxes that topical medications come in in order to help keep track of the instructions about where and how to use these. Pharmacies typically print the medication instructions only on the boxes and not directly on the medication tubes.   If your medication is too expensive, please contact our office at [phone] option 4 or send us a message through MyChart.   We are unable to tell what your co-pay for medications will be in advance as this is different depending on your insurance coverage. However, we may be able to find a substitute medication at lower cost or fill out paperwork to get insurance to cover a needed medication.   If a prior authorization is required to get your medication covered by your insurance company, please allow us 1-2 business days to complete this process.  Drug prices often vary depending on where the prescription is filled and some pharmacies may offer cheaper prices.  The website www.goodrx.com contains coupons for medications through different pharmacies. The prices here do not account for what the cost may be with help from insurance (it may be cheaper with your insurance), but the website can give you the [place] if you did not use any insurance.  - You can print the associated coupon and take it with   your prescription to the pharmacy.  - You may also stop by our office during regular business hours and pick up a GoodRx coupon card.  - If you need your prescription sent electronically to a different pharmacy, notify our office through [facility] MyChart or by phone at [phone] option 4.     Si Usted Necesita Algo Despus de Su Visita  Tambin puede enviarnos un mensaje a travs de MyChart. Por lo general respondemos a los mensajes de MyChart en el transcurso de 1 a 2  das hbiles.  Para renovar recetas, por favor pida a su farmacia que se ponga en contacto con nuestra oficina. Nuestro nmero de fax es el [phone].  Si tiene un asunto urgente cuando la clnica est cerrada y que no puede esperar hasta el siguiente da hbil, puede llamar/localizar a su doctor(a) al nmero que aparece a continuacin.   Por favor, tenga en cuenta que aunque hacemos todo lo posible para estar disponibles para asuntos urgentes fuera del horario de oficina, no estamos disponibles las 24 horas del da, los 7 das de la semana.   Si tiene un problema urgente y no puede comunicarse con nosotros, puede optar por buscar atencin mdica  en el consultorio de su doctor(a), en una clnica privada, en un centro de atencin urgente o en una sala de emergencias.  Si tiene una emergencia mdica, por favor llame inmediatamente al 911 o vaya a la sala de emergencias.  Nmeros de bper  - Dr. Kowalski: [phone]  - Dra. Moye: [phone]  - Dra. Stewart: [phone]  En caso de inclemencias del tiempo, por favor llame a nuestra lnea principal al [phone] para una actualizacin sobre el estado de cualquier retraso o cierre.  Consejos para la medicacin en dermatologa: Por favor, guarde las cajas en las que vienen los medicamentos de uso tpico para ayudarle a seguir las instrucciones sobre dnde y cmo usarlos. Las farmacias generalmente imprimen las instrucciones del medicamento slo en las cajas y no directamente en los tubos del medicamento.   Si su medicamento es muy caro, por favor, pngase en contacto con nuestra oficina llamando al [phone] y presione la opcin 4 o envenos un mensaje a travs de MyChart.   No podemos decirle cul ser su copago por los medicamentos por adelantado ya que esto es diferente dependiendo de la cobertura de su seguro. Sin embargo, es posible que podamos encontrar un medicamento sustituto a menor costo o llenar un formulario para que el  seguro cubra el medicamento que se considera necesario.   Si se requiere una autorizacin previa para que su compaa de seguros cubra su medicamento, por favor permtanos de 1 a 2 das hbiles para completar este proceso.  Los precios de los medicamentos varan con frecuencia dependiendo del lugar de dnde se surte la receta y alguna farmacias pueden ofrecer precios ms baratos.  El sitio web www.goodrx.com tiene cupones para medicamentos de diferentes farmacias. Los precios aqu no tienen en cuenta lo que podra costar con la ayuda del seguro (puede ser ms barato con su seguro), pero el sitio web puede darle el precio si no utiliz ningn seguro.  - Puede imprimir el cupn correspondiente y llevarlo con su receta a la farmacia.  - Tambin puede pasar por nuestra oficina durante el horario de atencin regular y recoger una tarjeta de cupones de GoodRx.  - Si necesita que su receta se enve electrnicamente a una farmacia diferente, informe a nuestra oficina a travs de MyChart de [facility]   o por telfono llamando al [phone] y presione la opcin 4.  

## 2021-10-24 NOTE — Progress Notes (Signed)
   Follow-Up Visit   Subjective  Rachael Carr is a 61 y.o. female who presents for the following: Suture / Staple Removal (1 week f/u suture removal on the right upper back- cyst excision ).   The following portions of the chart were reviewed this encounter and updated as appropriate:       Review of Systems:  No other skin or systemic complaints except as noted in HPI or Assessment and Plan.  Objective  Well appearing patient in no apparent distress; mood and affect are within normal limits.  A focused examination was performed including back. Relevant physical exam findings are noted in the Assessment and Plan.  Right Upper Back Excision site- healing well    Assessment & Plan  Epidermal inclusion cyst Right Upper Back  Biopsy results discussed- benign cyst   Encounter for Removal of Sutures - Incision site at the right upper back is clean, dry and intact - Wound cleansed, sutures removed, wound cleansed and steri strips applied.  - Discussed pathology results showing Epidermal cyst   - Patient advised to keep steri-strips dry until they fall off. - Scars remodel for a full year. - Once steri-strips fall off, patient can apply over-the-counter silicone scar cream each night to help with scar remodeling if desired. - Patient advised to call with any concerns or if they notice any new or changing lesions.    Return if symptoms worsen or fail to improve.  I, Marye Round, CMA, am acting as scribe for Brendolyn Patty, MD .   Documentation: I have reviewed the above documentation for accuracy and completeness, and I agree with the above.  Brendolyn Patty MD

## 2021-11-16 ENCOUNTER — Ambulatory Visit: Payer: BC Managed Care – PPO | Admitting: Obstetrics and Gynecology

## 2021-12-12 ENCOUNTER — Ambulatory Visit
Admission: RE | Admit: 2021-12-12 | Discharge: 2021-12-12 | Disposition: A | Discharge status: Home / Self Care | Payer: BC Managed Care – PPO | Source: Ambulatory Visit | Attending: Obstetrics and Gynecology | Admitting: Obstetrics and Gynecology

## 2021-12-12 ENCOUNTER — Ambulatory Visit (INDEPENDENT_AMBULATORY_CARE_PROVIDER_SITE_OTHER): Payer: BC Managed Care – PPO | Admitting: Obstetrics & Gynecology

## 2021-12-12 ENCOUNTER — Other Ambulatory Visit (HOSPITAL_COMMUNITY)
Admission: RE | Admit: 2021-12-12 | Discharge: 2021-12-12 | Disposition: A | Discharge status: Home / Self Care | Payer: BC Managed Care – PPO | Source: Ambulatory Visit | Attending: Obstetrics and Gynecology | Admitting: Obstetrics and Gynecology

## 2021-12-12 VITALS — BP 164/98 | HR 92 | Wt 163.0 lb

## 2021-12-12 DIAGNOSIS — Z0000 Encounter for general adult medical examination without abnormal findings: Secondary | ICD-10-CM

## 2021-12-12 DIAGNOSIS — N941 Unspecified dyspareunia: Secondary | ICD-10-CM

## 2021-12-12 DIAGNOSIS — Z1231 Encounter for screening mammogram for malignant neoplasm of breast: Secondary | ICD-10-CM | POA: Insufficient documentation

## 2021-12-12 DIAGNOSIS — Z124 Encounter for screening for malignant neoplasm of cervix: Secondary | ICD-10-CM

## 2021-12-12 DIAGNOSIS — Z87440 Personal history of urinary (tract) infections: Secondary | ICD-10-CM | POA: Diagnosis not present

## 2021-12-12 DIAGNOSIS — Z01419 Encounter for gynecological examination (general) (routine) without abnormal findings: Secondary | ICD-10-CM

## 2021-12-12 MED ORDER — ESTRADIOL 0.1 MG/GM VA CREA: TOPICAL_CREAM | VAGINAL | 12 refills | Status: DC

## 2021-12-12 NOTE — Progress Notes (Addendum)
Subjective:    Rachael Carr is a 61 y.o. married P2 who presents for an annual exam. The patient has no complaints today. The patient is not sexually active for the last 2 years due to dyspareunia due to atrophy. She would like a refill of vaginal estrogen (which she has not been using)  GYN screening history: last pap: was normal. The patient wears seatbelts: yes. The patient participates in regular exercise: not asked. Has the patient ever been transfused or tattooed?: yes. The patient reports that there is not domestic violence in her life.   Menstrual History: OB History     Gravida  2   Para  2   Term  2   Preterm      AB      Living  2      SAB      IAB      Ectopic      Multiple      Live Births  2            No LMP recorded. Patient is postmenopausal.    The following portions of the patient's history were reviewed and updated as appropriate: allergies, current medications, past family history, past medical history, past social history, past surgical history, and problem list.  Review of Systems Pertinent items are noted in HPI.  She has been married for 38 years. FH- pancreas cancer She will have a mammogram this afternoon.   Objective:    BP (!) 164/98   Pulse 92   Wt 163 lb 0.2 oz (73.9 kg)   BMI 26.31 kg/m   General Appearance:    Alert, cooperative, no distress, appears stated age  Head:    Normocephalic, without obvious abnormality, atraumatic  Eyes:    PERRL, conjunctiva/corneas clear, EOM's intact, fundi    benign, both eyes  Ears:    Normal TM's and external ear canals, both ears  Nose:   Nares normal, septum midline, mucosa normal, no drainage    or sinus tenderness  Throat:   Lips, mucosa, and tongue normal; teeth and gums normal  Neck:   Supple, symmetrical, trachea midline, no adenopathy;    thyroid:  no enlargement/tenderness/nodules; no carotid   bruit or JVD  Back:     Symmetric, no curvature, ROM normal, no CVA tenderness   Lungs:     Clear to auscultation bilaterally, respirations unlabored  Chest Wall:    No tenderness or deformity   Heart:    Regular rate and rhythm, S1 and S2 normal, no murmur, rub   or gallop  Breast Exam:    No tenderness, masses, or nipple abnormality  Abdomen:     Soft, non-tender, bowel sounds active all four quadrants,    no masses, no organomegaly  Genitalia:    Normal female without lesion, discharge or tenderness, normal size and shape, anteverted uterus, normal adnexal exam, 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    Assessment:    Healthy female exam.   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

## 2021-12-12 NOTE — Addendum Note (Signed)
Addended by: Emily Filbert on: 12/12/2021 10:47 AM   Modules accepted: Orders

## 2021-12-13 LAB — CYTOLOGY - PAP
Comment: NEGATIVE
Diagnosis: NEGATIVE
High risk HPV: NEGATIVE

## 2021-12-13 LAB — COMPREHENSIVE METABOLIC PANEL
ALT: 35 IU/L — ABNORMAL HIGH (ref 0–32)
AST: 26 IU/L (ref 0–40)
Albumin/Globulin Ratio: 2 (ref 1.2–2.2)
Albumin: 5.1 g/dL — ABNORMAL HIGH (ref 3.9–4.9)
Alkaline Phosphatase: 84 IU/L (ref 44–121)
BUN/Creatinine Ratio: 13 (ref 12–28)
BUN: 11 mg/dL (ref 8–27)
Bilirubin Total: 0.5 mg/dL (ref 0.0–1.2)
CO2: 24 mmol/L (ref 20–29)
Calcium: 10.1 mg/dL (ref 8.7–10.3)
Chloride: 100 mmol/L (ref 96–106)
Creatinine, Ser: 0.82 mg/dL (ref 0.57–1.00)
Globulin, Total: 2.5 g/dL (ref 1.5–4.5)
Glucose: 87 mg/dL (ref 70–99)
Potassium: 5.4 mmol/L — ABNORMAL HIGH (ref 3.5–5.2)
Sodium: 138 mmol/L (ref 134–144)
Total Protein: 7.6 g/dL (ref 6.0–8.5)
eGFR: 80 mL/min/{1.73_m2} (ref >59)

## 2021-12-13 LAB — VITAMIN D 25 HYDROXY (VIT D DEFICIENCY, FRACTURES): Vit D, 25-Hydroxy: 47.3 ng/mL (ref 30.0–100.0)

## 2021-12-13 LAB — CBC
Hematocrit: 43.4 % (ref 34.0–46.6)
Hemoglobin: 14.5 g/dL (ref 11.1–15.9)
MCH: 29.5 pg (ref 26.6–33.0)
MCHC: 33.4 g/dL (ref 31.5–35.7)
MCV: 88 fL (ref 79–97)
Platelets: 375 10*3/uL (ref 150–450)
RBC: 4.91 x10E6/uL (ref 3.77–5.28)
RDW: 12.3 % (ref 11.7–15.4)
WBC: 6.9 10*3/uL (ref 3.4–10.8)

## 2021-12-13 LAB — LIPID PANEL
Chol/HDL Ratio: 3.5 ratio (ref 0.0–4.4)
Cholesterol, Total: 228 mg/dL — ABNORMAL HIGH (ref 100–199)
HDL: 65 mg/dL (ref >39)
LDL Chol Calc (NIH): 128 mg/dL — ABNORMAL HIGH (ref 0–99)
Triglycerides: 203 mg/dL — ABNORMAL HIGH (ref 0–149)
VLDL Cholesterol Cal: 35 mg/dL (ref 5–40)

## 2021-12-13 LAB — TSH+FREE T4
Free T4: 1.1 ng/dL (ref 0.82–1.77)
TSH: 1.67 u[IU]/mL (ref 0.450–4.500)

## 2021-12-13 LAB — HEMOGLOBIN A1C
Est. average glucose Bld gHb Est-mCnc: 120 mg/dL
Hgb A1c MFr Bld: 5.8 % — ABNORMAL HIGH (ref 4.8–5.6)

## 2021-12-14 ENCOUNTER — Encounter: Payer: Self-pay | Admitting: Obstetrics & Gynecology

## 2021-12-15 ENCOUNTER — Ambulatory Visit: Payer: BC Managed Care – PPO | Admitting: Urology

## 2021-12-19 ENCOUNTER — Ambulatory Visit: Payer: BC Managed Care – PPO | Admitting: Obstetrics and Gynecology

## 2022-02-05 ENCOUNTER — Ambulatory Visit: Payer: BC Managed Care – PPO | Admitting: Dermatology

## 2022-02-21 ENCOUNTER — Ambulatory Visit: Payer: BC Managed Care – PPO | Admitting: Dermatology

## 2022-09-06 ENCOUNTER — Other Ambulatory Visit: Payer: Self-pay | Admitting: Obstetrics and Gynecology

## 2022-09-06 DIAGNOSIS — Z1231 Encounter for screening mammogram for malignant neoplasm of breast: Secondary | ICD-10-CM

## 2022-10-23 ENCOUNTER — Ambulatory Visit: Payer: BC Managed Care – PPO | Admitting: Obstetrics & Gynecology

## 2022-12-03 ENCOUNTER — Ambulatory Visit
Admission: RE | Admit: 2022-12-03 | Discharge: 2022-12-03 | Disposition: A | Discharge status: Home / Self Care | Payer: BC Managed Care – PPO | Source: Ambulatory Visit | Attending: Obstetrics and Gynecology | Admitting: Obstetrics and Gynecology

## 2022-12-03 ENCOUNTER — Other Ambulatory Visit (HOSPITAL_COMMUNITY)
Admission: RE | Admit: 2022-12-03 | Discharge: 2022-12-03 | Disposition: A | Discharge status: Home / Self Care | Payer: BC Managed Care – PPO | Source: Ambulatory Visit | Attending: Obstetrics & Gynecology | Admitting: Obstetrics & Gynecology

## 2022-12-03 ENCOUNTER — Ambulatory Visit (INDEPENDENT_AMBULATORY_CARE_PROVIDER_SITE_OTHER): Payer: BC Managed Care – PPO | Admitting: Obstetrics & Gynecology

## 2022-12-03 ENCOUNTER — Encounter: Payer: Self-pay | Admitting: Obstetrics & Gynecology

## 2022-12-03 VITALS — BP 136/83 | HR 90 | Wt 153.3 lb

## 2022-12-03 DIAGNOSIS — Z01419 Encounter for gynecological examination (general) (routine) without abnormal findings: Secondary | ICD-10-CM | POA: Diagnosis not present

## 2022-12-03 DIAGNOSIS — Z0000 Encounter for general adult medical examination without abnormal findings: Secondary | ICD-10-CM

## 2022-12-03 DIAGNOSIS — J302 Other seasonal allergic rhinitis: Secondary | ICD-10-CM

## 2022-12-03 DIAGNOSIS — Z124 Encounter for screening for malignant neoplasm of cervix: Secondary | ICD-10-CM

## 2022-12-03 DIAGNOSIS — Z1231 Encounter for screening mammogram for malignant neoplasm of breast: Secondary | ICD-10-CM | POA: Diagnosis present

## 2022-12-03 MED ORDER — ESTRADIOL 0.1 MG/GM VA CREA: TOPICAL_CREAM | VAGINAL | 12 refills | Status: AC

## 2022-12-03 MED ORDER — ALBUTEROL SULFATE HFA 108 (90 BASE) MCG/ACT IN AERS: 2.0000 | INHALATION_SPRAY | Freq: Four times a day (QID) | RESPIRATORY_TRACT | 3 refills | Status: AC | PRN

## 2022-12-03 NOTE — Progress Notes (Signed)
ANNUAL PREVENTATIVE CARE GYNECOLOGY  ENCOUNTER NOTE  Subjective:       Rachael Carr is a married 62 y.o. G12P2002 female here for a routine annual gynecologic exam. The patient is sexually active. The patient is not taking hormone replacement therapy. Patient denies post-menopausal vaginal bleeding. The patient wears seatbelts: yes. The patient participates in regular exercise: yes. Has the patient ever been transfused or tattooed?: yes. The patient reports that there is not domestic violence in her life.  Current complaints: 1.  She needs a refill of her asthma meds and her vaginal estrogen. She would like fasting labs today.   Gynecologic History No LMP recorded. Patient is postmenopausal. Last Pap: 2023. Results were: normal Last mammogram: 2023. Results were: normal Last Colonoscopy:  Last Dexa Scan:    Obstetric History OB History  Gravida Para Term Preterm AB Living  2 2 2     2   SAB IAB Ectopic Multiple Live Births          2    # Outcome Date GA Lbr Len/2nd Weight Sex Type Anes PTL Lv  2 Term           1 Term             Past Medical History:  Diagnosis Date   Abnormal mammogram 11/2016   PASH LT breast   Allergy    Depression    Dysplasia of cervix, low grade (CIN 1) 2009   Elevated LFTs    Insomnia     Family History  Problem Relation Age of Onset   Congestive Heart Failure Mother    Diabetes Mother        DM Type 2   Pancreatic cancer Father 36   Hypertension Father    Other Sister        elevated LFTs   Other Sister        elevated LFTs   Bone cancer Maternal Uncle 63   Throat cancer Maternal Uncle 65   Stomach cancer Maternal Grandmother    Breast cancer Neg Hx     Past Surgical History:  Procedure Laterality Date   BREAST BIOPSY Left 12/10/2016   [place] bx FIBROCYSTIC CHANGE, PASH   CHOLECYSTECTOMY  1989   COLONOSCOPY  2013   polyp; repeat in 5 yrs.   COLONOSCOPY  2019   DILATATION & CURETTAGE/HYSTEROSCOPY WITH MYOSURE N/A 09/27/2015    Procedure: DILATATION & CURETTAGE/HYSTEROSCOPY WITH MYOSURE;  Surgeon: Conard Novak, MD;  Location: [facility] ORS;  Service: Gynecology;  Laterality: N/A;   FOOT SURGERY Left 2006   TUBAL LIGATION     VAGINA SURGERY  1989    Social History   Socioeconomic History   Marital status: Married    Spouse name: Not on file   Number of children: Not on file   Years of education: Not on file   Highest education level: Not on file  Occupational History   Not on file  Tobacco Use   Smoking status: Former    Types: Cigarettes   Smokeless tobacco: Never   Tobacco comments:    QUIT IN 1980'S  Vaping Use   Vaping status: Never Used  Substance and Sexual Activity   Alcohol use: Yes    Alcohol/week: 0.0 standard drinks of alcohol    Comment: OCCASIONALLY   Drug use: No   Sexual activity: Yes    Birth control/protection: Post-menopausal  Other Topics Concern   Not on file  Social History Narrative  Not on file   Social Determinants of Health   Financial Resource Strain: Not on file  Food Insecurity: Not on file  Transportation Needs: Not on file  Physical Activity: Not on file  Stress: Not on file  Social Connections: Not on file  Intimate Partner Violence: Not on file    Current Outpatient Medications on File Prior to Visit  Medication Sig Dispense Refill   Ascorbic Acid (VITAMIN C PO) Take by mouth.     calcium carbonate (OS-CAL - DOSED IN MG OF ELEMENTAL CALCIUM) 1250 (500 Ca) MG tablet Take 1 tablet by mouth daily.     cetirizine (ZYRTEC) 10 MG tablet Take 10 mg by mouth daily.     Cholecalciferol (VITAMIN D3) 1.25 MG (50000 UT) CAPS Take 1 capsule by mouth daily.     estradiol (ESTRACE VAGINAL) 0.1 MG/GM vaginal cream 1 applicator per vagina 3 nights per week 42.5 g 12   fluticasone (FLONASE) 50 MCG/ACT nasal spray Place into both nostrils daily.     milk thistle 175 MG tablet Take 175 mg by mouth daily.     Turmeric 500 MG TABS Take 1 tablet by mouth daily.     albuterol  (VENTOLIN HFA) 108 (90 Base) MCG/ACT inhaler Inhale 2 puffs into the lungs every 6 (six) hours as needed for wheezing or shortness of breath. 1 each 3   No current facility-administered medications on file prior to visit.    Allergies  Allergen Reactions   Neosporin Original [Bacitracin-Neomycin-Polymyxin]    Codeine Palpitations      Review of Systems ROS Review of Systems - General ROS: negative for - chills, fatigue, fever, hot flashes, night sweats, weight gain or weight loss Psychological ROS: negative for - anxiety, decreased libido, depression, mood swings, physical abuse or sexual abuse Ophthalmic ROS: negative for - blurry vision, eye pain or loss of vision ENT ROS: negative for - headaches, hearing change, visual changes or vocal changes Allergy and Immunology ROS: negative for - hives, itchy/watery eyes or seasonal allergies Hematological and Lymphatic ROS: negative for - bleeding problems, bruising, swollen lymph nodes or weight loss Endocrine ROS: negative for - galactorrhea, hair pattern changes, hot flashes, malaise/lethargy, mood swings, palpitations, polydipsia/polyuria, skin changes, temperature intolerance or unexpected weight changes Breast ROS: negative for - new or changing breast lumps or nipple discharge Respiratory ROS: negative for - cough or shortness of breath Cardiovascular ROS: negative for - chest pain, irregular heartbeat, palpitations or shortness of breath Gastrointestinal ROS: no abdominal pain, change in bowel habits, or black or bloody stools Genito-Urinary ROS: no dysuria, trouble voiding, or hematuria Musculoskeletal ROS: negative for - joint pain or joint stiffness Neurological ROS: negative for - bowel and bladder control changes Dermatological ROS: negative for rash and skin lesion changes   Objective:   BP (!) 147/92   Pulse 93   Wt 153 lb 4.8 oz (69.5 kg)   BMI 24.74 kg/m  CONSTITUTIONAL: Well-developed, well-nourished female in no  acute distress.  PSYCHIATRIC: Normal mood and affect. Normal behavior. Normal judgment and thought content. NEUROLGIC: Alert and oriented to person, place, and time. Normal muscle tone coordination. No cranial nerve deficit noted. HENT:  Normocephalic, atraumatic, External right and left ear normal. Oropharynx is clear and moist EYES: Conjunctivae and EOM are normal. Pupils are equal, round, and reactive to light. No scleral icterus.  NECK: Normal range of motion, supple, no masses.  Normal thyroid.  SKIN: Skin is warm and dry. No rash noted. Not diaphoretic. No  erythema. No pallor. CARDIOVASCULAR: Normal heart rate noted, regular rhythm, no murmur. RESPIRATORY: Clear to auscultation bilaterally. Effort and breath sounds normal, no problems with respiration noted. BREASTS: Symmetric in size. No masses, skin changes, nipple drainage, or lymphadenopathy. ABDOMEN: Soft, normal bowel sounds, no distention noted.  No tenderness, rebound or guarding.  BLADDER: Normal PELVIC:  Bladder no bladder distension noted  Urethra: normal appearing urethra with no masses, tenderness or lesions  Vulva: mild VVA  Vagina: normal appearing vagina with normal color and discharge, no lesions  Cervix: normal appearing cervix without discharge or lesions  Uterus: uterus is normal size, shape, consistency and nontender  Adnexa: no masses  RV: External Exam NormaI  MUSCULOSKELETAL: Normal range of motion. No tenderness.  No cyanosis, clubbing, or edema.  2+ distal pulses. LYMPHATIC: No Axillary, Supraclavicular, or Inguinal Adenopathy.   Labs: Lab Results  Component Value Date   WBC 6.9 12/12/2021   HGB 14.5 12/12/2021   HCT 43.4 12/12/2021   MCV 88 12/12/2021   PLT 375 12/12/2021    Lab Results  Component Value Date   CREATININE 0.82 12/12/2021   BUN 11 12/12/2021   NA 138 12/12/2021   K 5.4 (H) 12/12/2021   CL 100 12/12/2021   CO2 24 12/12/2021    Lab Results  Component Value Date   ALT 35 (H)  12/12/2021   AST 26 12/12/2021   ALKPHOS 84 12/12/2021   BILITOT 0.5 12/12/2021    Lab Results  Component Value Date   CHOL 228 (H) 12/12/2021   HDL 65 12/12/2021   LDLCALC 128 (H) 12/12/2021   TRIG 203 (H) 12/12/2021   CHOLHDL 3.5 12/12/2021    Lab Results  Component Value Date   TSH 1.670 12/12/2021    Lab Results  Component Value Date   HGBA1C 5.8 (H) 12/12/2021     Assessment:   No diagnosis found.   Plan:  Pap: done at patient request Mammogram: will be obtained today Labs: Lipid 1, FBS, TSH, and Hemoglobin A1C Routine preventative health maintenance measures emphasized:  Flu vaccine status: already done  Return to Clinic - 1 Year   Allie Bossier, MD [facility]

## 2022-12-04 ENCOUNTER — Encounter: Payer: Self-pay | Admitting: Obstetrics & Gynecology

## 2022-12-04 LAB — COMPREHENSIVE METABOLIC PANEL
ALT: 20 [IU]/L (ref 0–32)
AST: 24 [IU]/L (ref 0–40)
Albumin: 4.5 g/dL (ref 3.9–4.9)
Alkaline Phosphatase: 78 [IU]/L (ref 44–121)
BUN/Creatinine Ratio: 15 (ref 12–28)
BUN: 13 mg/dL (ref 8–27)
Bilirubin Total: 0.6 mg/dL (ref 0.0–1.2)
CO2: 24 mmol/L (ref 20–29)
Calcium: 9.5 mg/dL (ref 8.7–10.3)
Chloride: 104 mmol/L (ref 96–106)
Creatinine, Ser: 0.85 mg/dL (ref 0.57–1.00)
Globulin, Total: 2.4 g/dL (ref 1.5–4.5)
Glucose: 83 mg/dL (ref 70–99)
Potassium: 4.8 mmol/L (ref 3.5–5.2)
Sodium: 140 mmol/L (ref 134–144)
Total Protein: 6.9 g/dL (ref 6.0–8.5)
eGFR: 76 mL/min/{1.73_m2} (ref >59)

## 2022-12-04 LAB — LIPID PANEL
Chol/HDL Ratio: 2.8 {ratio} (ref 0.0–4.4)
Cholesterol, Total: 179 mg/dL (ref 100–199)
HDL: 64 mg/dL (ref >39)
LDL Chol Calc (NIH): 99 mg/dL (ref 0–99)
Triglycerides: 87 mg/dL (ref 0–149)
VLDL Cholesterol Cal: 16 mg/dL (ref 5–40)

## 2022-12-04 LAB — TSH+FREE T4
Free T4: 1.1 ng/dL (ref 0.82–1.77)
TSH: 1.31 u[IU]/mL (ref 0.450–4.500)

## 2022-12-04 LAB — HEMOGLOBIN A1C
Est. average glucose Bld gHb Est-mCnc: 123 mg/dL
Hgb A1c MFr Bld: 5.9 % — ABNORMAL HIGH (ref 4.8–5.6)

## 2022-12-04 LAB — CBC
Hematocrit: 40.8 % (ref 34.0–46.6)
Hemoglobin: 13.5 g/dL (ref 11.1–15.9)
MCH: 29.5 pg (ref 26.6–33.0)
MCHC: 33.1 g/dL (ref 31.5–35.7)
MCV: 89 fL (ref 79–97)
Platelets: 357 10*3/uL (ref 150–450)
RBC: 4.57 x10E6/uL (ref 3.77–5.28)
RDW: 12 % (ref 11.7–15.4)
WBC: 4.3 10*3/uL (ref 3.4–10.8)

## 2022-12-05 LAB — CYTOLOGY - PAP
Adequacy: ABSENT
Comment: NEGATIVE
Diagnosis: NEGATIVE
High risk HPV: NEGATIVE

## 2023-01-22 ENCOUNTER — Telehealth: Payer: Self-pay

## 2023-01-22 NOTE — Telephone Encounter (Signed)
 TRIAGE VOICEMAIL: Patient requesting rx's for albuterol (VENTOLIN HFA) 108 (90 Base) MCG/ACT inhaler &   estradiol (ESTRACE VAGINAL) 0.1 MG/GM vaginal cream  Be transferred to [facility].

## 2023-01-22 NOTE — Telephone Encounter (Signed)
 Spoke with patient. Advised to contact [facility] to request the transfer of these rx's from [facility].

## 2023-01-22 NOTE — Telephone Encounter (Signed)
 Attempted to reach pharmacy to request the transfer for the patient. Line rang x5 mintues with no answer.

## 2023-08-09 DIAGNOSIS — Z1371 Encounter for nonprocreative screening for genetic disease carrier status: Secondary | ICD-10-CM

## 2023-08-09 DIAGNOSIS — Z803 Family history of malignant neoplasm of breast: Secondary | ICD-10-CM

## 2023-08-09 HISTORY — DX: Encounter for nonprocreative screening for genetic disease carrier status: Z13.71

## 2023-08-09 HISTORY — DX: Family history of malignant neoplasm of breast: Z80.3

## 2023-10-04 ENCOUNTER — Other Ambulatory Visit: Payer: Self-pay | Admitting: Obstetrics & Gynecology

## 2023-10-04 DIAGNOSIS — Z1231 Encounter for screening mammogram for malignant neoplasm of breast: Secondary | ICD-10-CM

## 2023-10-30 ENCOUNTER — Ambulatory Visit
Admission: RE | Admit: 2023-10-30 | Discharge: 2023-10-30 | Disposition: A | Discharge status: Home / Self Care | Source: Ambulatory Visit | Attending: Obstetrics & Gynecology | Admitting: Obstetrics & Gynecology

## 2023-10-30 ENCOUNTER — Other Ambulatory Visit: Payer: Self-pay | Admitting: Obstetrics & Gynecology

## 2023-10-30 ENCOUNTER — Other Ambulatory Visit (HOSPITAL_COMMUNITY)
Admission: RE | Admit: 2023-10-30 | Discharge: 2023-10-30 | Disposition: A | Discharge status: Home / Self Care | Source: Ambulatory Visit | Attending: Obstetrics & Gynecology | Admitting: Obstetrics & Gynecology

## 2023-10-30 ENCOUNTER — Encounter: Payer: Self-pay | Admitting: Obstetrics & Gynecology

## 2023-10-30 ENCOUNTER — Ambulatory Visit (INDEPENDENT_AMBULATORY_CARE_PROVIDER_SITE_OTHER): Admitting: Obstetrics & Gynecology

## 2023-10-30 VITALS — BP 155/96 | HR 88 | Ht 66.0 in | Wt 158.0 lb

## 2023-10-30 DIAGNOSIS — Z124 Encounter for screening for malignant neoplasm of cervix: Secondary | ICD-10-CM | POA: Insufficient documentation

## 2023-10-30 DIAGNOSIS — Z0000 Encounter for general adult medical examination without abnormal findings: Secondary | ICD-10-CM

## 2023-10-30 DIAGNOSIS — Z01419 Encounter for gynecological examination (general) (routine) without abnormal findings: Secondary | ICD-10-CM

## 2023-10-30 DIAGNOSIS — Z800 Family history of malignant neoplasm of digestive organs: Secondary | ICD-10-CM

## 2023-10-30 DIAGNOSIS — Z1231 Encounter for screening mammogram for malignant neoplasm of breast: Secondary | ICD-10-CM | POA: Insufficient documentation

## 2023-10-30 MED ORDER — ESTRADIOL 0.01 % VA CREA: TOPICAL_CREAM | VAGINAL | 12 refills | Status: AC

## 2023-10-30 NOTE — Progress Notes (Signed)
 ANNUAL PREVENTATIVE CARE GYNECOLOGY  ENCOUNTER NOTE  Subjective:       Rachael Carr is a 63 y.o. married G2P2002 (36 and 10 yo kids, 2 1/2 grands) here for a routine annual gynecologic exam. The patient is sexually active. The vaginal estrogen has helped her VVA and the dyspareunia has improved. She is only using it once per week. The patient is not taking hormone replacement therapy. Patient denies post-menopausal vaginal bleeding. The patient wears seatbelts: yes. The patient participates in regular exercise: yes. (Walking) Has the patient ever been transfused or tattooed?: yes. The patient reports that there is not domestic violence in her life.  Current complaints: 1.  She needs a refill of her vaginal estrogen cream.    Gynecologic History No LMP recorded. Patient is postmenopausal.  Last Pap: 2024. Results were: normal, denies h/o abnormals Last mammogram: scheduled for today Last Colonoscopy: 2/19     Obstetric History OB History  Gravida Para Term Preterm AB Living  2 2 2   2   SAB IAB Ectopic Multiple Live Births      2    # Outcome Date GA Lbr Len/2nd Weight Sex Type Anes PTL Lv  2 Term         LIV  1 Term         LIV    Past Medical History:  Diagnosis Date   Abnormal mammogram 11/2016   PASH LT breast   Allergy    Depression    Dysplasia of cervix, low grade (CIN 1) 2009   Elevated LFTs    Insomnia     Family History  Problem Relation Age of Onset   Congestive Heart Failure Mother    Diabetes Mother        DM Type 2   Pancreatic cancer Father 65   Hypertension Father    Other Sister        elevated LFTs   Other Sister        elevated LFTs   Bone cancer Maternal Uncle 63   Throat cancer Maternal Uncle 65   Stomach cancer Maternal Grandmother    Colon cancer Maternal Grandmother    Breast cancer Niece/Nephew        Maternal Niece    Past Surgical History:  Procedure Laterality Date   BREAST BIOPSY Left 12/10/2016   [place]  bx FIBROCYSTIC  CHANGE, PASH   CHOLECYSTECTOMY  1989   COLONOSCOPY  2013   polyp; repeat in 5 yrs.   COLONOSCOPY  2019   DILATATION & CURETTAGE/HYSTEROSCOPY WITH MYOSURE N/A 09/27/2015   Procedure: DILATATION & CURETTAGE/HYSTEROSCOPY WITH MYOSURE;  Surgeon: Garnette JONETTA Mace, MD;  Location: [facility] OR[facility];  Service: Gynecology;  Laterality: N/A;   FOOT SURGERY Left 2006   TUBAL LIGATION     VAGINA SURGERY  1989    Social History   Socioeconomic History   Marital status: Married    Spouse name: Not on file   Number of children: Not on file   Years of education: Not on file   Highest education level: Not on file  Occupational History   Not on file  Tobacco Use   Smoking status: Former    Types: Cigarettes   Smokeless tobacco: Never   Tobacco comments:    QUIT IN 1980'S  Vaping Use   Vaping status: Never Used  Substance and Sexual Activity   Alcohol use: Yes    Alcohol/week: 0.0 standard drinks of alcohol    Comment: OCCASIONALLY  Drug use: No   Sexual activity: Yes    Birth control/protection: Post-menopausal  Other Topics Concern   Not on file  Social History Narrative   Not on file   Social Drivers of Health   Financial Resource Strain: Not on file  Food Insecurity: Not on file  Transportation Needs: Not on file  Physical Activity: Not on file  Stress: Not on file  Social Connections: Not on file  Intimate Partner Violence: Not on file    Current Outpatient Medications on File Prior to Visit  Medication Sig Dispense Refill   albuterol  (VENTOLIN  HFA) 108 (90 Base) MCG/ACT inhaler Inhale 2 puffs into the lungs every 6 (six) hours as needed for wheezing or shortness of breath. 1 each 3   Ascorbic Acid (VITAMIN C PO) Take by mouth.     calcium carbonate (OS-CAL - DOSED IN MG OF ELEMENTAL CALCIUM) 1250 (500 Ca) MG tablet Take 1 tablet by mouth daily.     cetirizine (ZYRTEC) 10 MG tablet Take 10 mg by mouth daily.     Cholecalciferol (VITAMIN D3) 1.25 MG (50000 UT) CAPS Take 1 capsule  by mouth daily.     estradiol  (ESTRACE  VAGINAL) 0.1 MG/GM vaginal cream 1 applicator per vagina 3 nights per week 42.5 g 12   fluticasone (FLONASE) 50 MCG/ACT nasal spray Place into both nostrils daily.     milk thistle 175 MG tablet Take 175 mg by mouth daily.     Turmeric 500 MG TABS Take 1 tablet by mouth daily.     No current facility-administered medications on file prior to visit.    Allergies  Allergen Reactions   Neosporin Original [Bacitracin-Neomycin-Polymyxin]    Codeine Palpitations      Review of Systems ROS Review of Systems - General ROS: negative for - chills, fatigue, fever, hot flashes, night sweats, weight gain or weight loss Psychological ROS: negative for - anxiety, decreased libido, depression, mood swings, physical abuse or sexual abuse Ophthalmic ROS: negative for - blurry vision, eye pain or loss of vision ENT ROS: negative for - headaches, hearing change, visual changes or vocal changes Allergy and Immunology ROS: negative for - hives, itchy/watery eyes or seasonal allergies Hematological and Lymphatic ROS: negative for - bleeding problems, bruising, swollen lymph nodes or weight loss Endocrine ROS: negative for - galactorrhea, hair pattern changes, hot flashes, malaise/lethargy, mood swings, palpitations, polydipsia/polyuria, skin changes, temperature intolerance or unexpected weight changes Breast ROS: negative for - new or changing breast lumps or nipple discharge Respiratory ROS: negative for - cough or shortness of breath Cardiovascular ROS: negative for - chest pain, irregular heartbeat, palpitations or shortness of breath Gastrointestinal ROS: no abdominal pain, change in bowel habits, or black or bloody stools Genito-Urinary ROS: no dysuria, trouble voiding, or hematuria Musculoskeletal ROS: negative for - joint pain or joint stiffness Neurological ROS: negative for - bowel and bladder control changes Dermatological ROS: negative for rash and skin  lesion changes   Objective:   BP (!) 155/96   Pulse 88   Ht 5' 6 (1.676 m)   Wt 158 lb (71.7 kg)   BMI 25.50 kg/m  CONSTITUTIONAL: Well-developed, well-nourished female in no acute distress.  PSYCHIATRIC: Normal mood and affect. Normal behavior. Normal judgment and thought content. NEUROLGIC: Alert and oriented to person, place, and time. Normal muscle tone coordination. No cranial nerve deficit noted. HENT:  Normocephalic, atraumatic, External right and left ear normal. Oropharynx is clear and moist EYES: Conjunctivae and EOM are normal. Pupils are  equal, round, and reactive to light. No scleral icterus.  NECK: Normal range of motion, supple, no masses.  Normal thyroid.  SKIN: Skin is warm and dry. No rash noted. Not diaphoretic. No erythema. No pallor. CARDIOVASCULAR: Normal heart rate noted, regular rhythm, no murmur. RESPIRATORY: Clear to auscultation bilaterally. Effort and breath sounds normal, no problems with respiration noted. BREASTS: Symmetric in size. No masses, skin changes, nipple drainage, or lymphadenopathy. ABDOMEN: Soft, normal bowel sounds, no distention noted.  No tenderness, rebound or guarding.  EG- moderate VVA Pederson speculum placed. Stenotic atrophic cervix noted to be normal. normal size and shape, anteverted uterus, non-palpable adnexa, no masses or tenderness MUSCULOSKELETAL: Normal range of motion. No tenderness.  No cyanosis, clubbing, or edema.  2+ distal pulses. LYMPHATIC: No Axillary, Supraclavicular, or Inguinal Adenopathy.   Labs: Lab Results  Component Value Date   WBC 4.3 12/03/2022   HGB 13.5 12/03/2022   HCT 40.8 12/03/2022   MCV 89 12/03/2022   PLT 357 12/03/2022    Lab Results  Component Value Date   CREATININE 0.85 12/03/2022   BUN 13 12/03/2022   NA 140 12/03/2022   K 4.8 12/03/2022   CL 104 12/03/2022   CO2 24 12/03/2022    Lab Results  Component Value Date   ALT 20 12/03/2022   AST 24 12/03/2022   ALKPHOS 78  12/03/2022   BILITOT 0.6 12/03/2022    Lab Results  Component Value Date   CHOL 179 12/03/2022   HDL 64 12/03/2022   LDLCALC 99 12/03/2022   TRIG 87 12/03/2022   CHOLHDL 2.8 12/03/2022    Lab Results  Component Value Date   TSH 1.310 12/03/2022    Lab Results  Component Value Date   HGBA1C 5.9 (H) 12/03/2022     Assessment:   Well woman exam Family h/o colon and pancreas cancer Screening for cervical cancer- She is aware of ACOG recs and still wants it.  Plan:  Pap: done Colon Screening:  UTD Genetic testing Routine preventative health maintenance measures emphasized:  Flu vaccine status: scheduled for this Friday at Dr. Karle office Repeat A1C since it was 5.9 last year Return to Clinic - 1 Year   Harland JAYSON Birkenhead, MD [facility]

## 2023-10-31 LAB — HEMOGLOBIN A1C
Est. average glucose Bld gHb Est-mCnc: 114 mg/dL
Hgb A1c MFr Bld: 5.6 % (ref 4.8–5.6)

## 2023-11-01 LAB — CYTOLOGY - PAP
Comment: NEGATIVE
Diagnosis: NEGATIVE
High risk HPV: NEGATIVE

## 2023-11-14 ENCOUNTER — Encounter: Payer: Self-pay | Admitting: Obstetrics and Gynecology

## 2023-11-19 ENCOUNTER — Encounter: Payer: Self-pay | Admitting: Obstetrics & Gynecology
